# Patient Record
Sex: Female | Born: 1965 | Race: White | Hispanic: No | Marital: Married | Smoking: Former smoker
Health system: Southern US, Community
[De-identification: ages and names within clinical notes are randomized; demographics above are authoritative.]

## PROBLEM LIST (undated history)

## (undated) DIAGNOSIS — E785 Hyperlipidemia, unspecified: Secondary | ICD-10-CM

## (undated) DIAGNOSIS — Z973 Presence of spectacles and contact lenses: Secondary | ICD-10-CM

## (undated) DIAGNOSIS — R42 Dizziness and giddiness: Secondary | ICD-10-CM

## (undated) DIAGNOSIS — K5792 Diverticulitis of intestine, part unspecified, without perforation or abscess without bleeding: Secondary | ICD-10-CM

## (undated) DIAGNOSIS — K589 Irritable bowel syndrome without diarrhea: Secondary | ICD-10-CM

## (undated) HISTORY — DX: Hyperlipidemia, unspecified: E78.5

## (undated) HISTORY — DX: Diverticulitis of intestine, part unspecified, without perforation or abscess without bleeding: K57.92

## (undated) HISTORY — DX: Irritable bowel syndrome, unspecified: K58.9

---

## 2011-12-27 ENCOUNTER — Ambulatory Visit: Payer: Self-pay | Admitting: Family Medicine

## 2013-01-01 ENCOUNTER — Ambulatory Visit: Payer: Self-pay | Admitting: Family Medicine

## 2014-05-12 ENCOUNTER — Ambulatory Visit: Payer: Self-pay | Admitting: Family Medicine

## 2015-03-01 DIAGNOSIS — R519 Headache, unspecified: Secondary | ICD-10-CM | POA: Insufficient documentation

## 2015-03-01 DIAGNOSIS — Z9189 Other specified personal risk factors, not elsewhere classified: Secondary | ICD-10-CM | POA: Insufficient documentation

## 2015-03-01 DIAGNOSIS — K589 Irritable bowel syndrome without diarrhea: Secondary | ICD-10-CM | POA: Insufficient documentation

## 2015-03-01 DIAGNOSIS — Z0000 Encounter for general adult medical examination without abnormal findings: Secondary | ICD-10-CM | POA: Insufficient documentation

## 2015-03-01 DIAGNOSIS — R51 Headache: Secondary | ICD-10-CM

## 2015-03-02 ENCOUNTER — Encounter: Payer: Self-pay | Admitting: Family Medicine

## 2015-03-02 ENCOUNTER — Ambulatory Visit (INDEPENDENT_AMBULATORY_CARE_PROVIDER_SITE_OTHER): Payer: BLUE CROSS/BLUE SHIELD | Admitting: Family Medicine

## 2015-03-02 VITALS — BP 120/80 | HR 68 | Ht 62.0 in | Wt 137.0 lb

## 2015-03-02 DIAGNOSIS — K589 Irritable bowel syndrome without diarrhea: Secondary | ICD-10-CM | POA: Insufficient documentation

## 2015-03-02 DIAGNOSIS — G43809 Other migraine, not intractable, without status migrainosus: Secondary | ICD-10-CM

## 2015-03-02 DIAGNOSIS — Z87891 Personal history of nicotine dependence: Secondary | ICD-10-CM | POA: Insufficient documentation

## 2015-03-02 DIAGNOSIS — E782 Mixed hyperlipidemia: Secondary | ICD-10-CM | POA: Insufficient documentation

## 2015-03-02 DIAGNOSIS — R531 Weakness: Secondary | ICD-10-CM

## 2015-03-02 DIAGNOSIS — E785 Hyperlipidemia, unspecified: Secondary | ICD-10-CM | POA: Insufficient documentation

## 2015-03-02 LAB — GLUCOSE, POCT (MANUAL RESULT ENTRY): POC GLUCOSE: 102 mg/dL — AB (ref 70–99)

## 2015-03-02 NOTE — Progress Notes (Signed)
Name: Diane Marks   MRN: [id number]    DOB: 05/02/1966   Date:03/02/2015       Progress Note  Subjective  Chief Complaint  Chief Complaint  Patient presents with  . Eye Problem    sees "bright squiggly lines" at least once a day- had normal eye exam in April    Eye Problem  Both eyes are affected.This is a new problem. The current episode started 1 to 4 weeks ago. The problem occurs intermittently. The problem has been waxing and waning. There was no injury mechanism. The patient is experiencing no pain. There is no known exposure to pink eye. She wears contacts. Associated symptoms include nausea. Pertinent negatives include no blurred vision, double vision, fever, photophobia or tingling. Associated symptoms comments: "bright squigley line"/ generalized brightness/ afterwards generalized muscle wealness and overall fatigue.    No problem-specific assessment & plan notes found for this encounter.   Past Medical History  Diagnosis Date  . Irritable bowel syndrome   . Hyperlipidemia     Past Surgical History  Procedure Laterality Date  . Cesarean section      Family History  Problem Relation Age of Onset  . Hyperlipidemia Mother   . Diabetes Father   . Cancer Father   . Hyperlipidemia Father   . Hypertension Father   . Heart disease Maternal Grandmother   . Cancer Paternal Grandmother     History   Social History  . Marital Status: Married    Spouse Name: N/A  . Number of Children: N/A  . Years of Education: N/A   Occupational History  . Not on file.   Social History Main Topics  . Smoking status: Former Games developermoker  . Smokeless tobacco: Not on file  . Alcohol Use: 0.0 oz/week    0 Standard drinks or equivalent per week  . Drug Use: No  . Sexual Activity: Yes    Birth Control/ Protection: None   Other Topics Concern  . Not on file   Social History Narrative  . No narrative on file    No Known Allergies   Review of Systems  Constitutional: Negative  for fever, chills, weight loss and malaise/fatigue.  HENT: Negative for ear discharge, ear pain and sore throat.   Eyes: Negative for blurred vision, double vision and photophobia.  Respiratory: Negative for cough, sputum production, shortness of breath and wheezing.   Cardiovascular: Negative for chest pain, palpitations and leg swelling.  Gastrointestinal: Positive for nausea. Negative for heartburn, abdominal pain, diarrhea, constipation, blood in stool and melena.  Genitourinary: Negative for dysuria, urgency, frequency and hematuria.  Musculoskeletal: Negative for myalgias, back pain, joint pain and neck pain.  Skin: Negative for rash.  Neurological: Negative for dizziness, tingling, sensory change, focal weakness and headaches.  Endo/Heme/Allergies: Negative for environmental allergies and polydipsia. Does not bruise/bleed easily.  Psychiatric/Behavioral: Negative for depression and suicidal ideas. The patient is not nervous/anxious and does not have insomnia.   All other systems reviewed and are negative.    Objective  Filed Vitals:   03/02/15 0905  BP: 120/80  Pulse: 68  Height: 5\' 2"  (1.575 m)  Weight: 137 lb (62.143 kg)    Physical Exam  Constitutional: She is oriented to person, place, and time and well-developed, well-nourished, and in no distress.  HENT:  Head: Normocephalic.  Right Ear: External ear normal.  Left Ear: External ear normal.  Mouth/Throat: Oropharynx is clear and moist.  Eyes: Right eye visual fields normal and left eye  visual fields normal. Conjunctivae, EOM and lids are normal. Pupils are equal, round, and reactive to light. No scleral icterus.  Fundoscopic exam:      The right eye shows no AV nicking, no hemorrhage and no papilledema.       The left eye shows no AV nicking, no exudate, no hemorrhage and no papilledema.  Neck: Normal range of motion. Neck supple.  Cardiovascular: Normal rate, regular rhythm, normal heart sounds and intact distal  pulses.   No murmur heard. Pulmonary/Chest: Effort normal and breath sounds normal. She has no wheezes.  Abdominal: Soft. Bowel sounds are normal.  Neurological: She is alert and oriented to person, place, and time. She has normal sensation, normal strength, normal reflexes and intact cranial nerves. No cranial nerve deficit.  Psychiatric: Mood and affect normal.      Recent Results (from the past 2160 hour(s))  POCT Glucose (CBG)     Status: Abnormal   Collection Time: 03/02/15  9:29 AM  Result Value Ref Range   POC Glucose 102 (A) 70 - 99 mg/dl     Assessment & Plan  Problem List Items Addressed This Visit    None    Visit Diagnoses    Weakness    -  Primary    Relevant Orders    POCT Glucose (CBG) (Completed)    Ambulatory referral to Neurology    Migraine variant        Relevant Orders    Ambulatory referral to Neurology         Dr. Elizabeth Sauer [facility]  03/02/2015

## 2015-03-09 DIAGNOSIS — H539 Unspecified visual disturbance: Secondary | ICD-10-CM | POA: Insufficient documentation

## 2015-03-09 DIAGNOSIS — R5383 Other fatigue: Secondary | ICD-10-CM | POA: Insufficient documentation

## 2015-06-25 ENCOUNTER — Ambulatory Visit (INDEPENDENT_AMBULATORY_CARE_PROVIDER_SITE_OTHER): Payer: BLUE CROSS/BLUE SHIELD | Admitting: Family Medicine

## 2015-06-25 ENCOUNTER — Encounter: Payer: Self-pay | Admitting: Family Medicine

## 2015-06-25 VITALS — BP 120/80 | HR 72 | Ht 62.0 in | Wt 139.0 lb

## 2015-06-25 DIAGNOSIS — Z0000 Encounter for general adult medical examination without abnormal findings: Secondary | ICD-10-CM | POA: Diagnosis not present

## 2015-06-25 DIAGNOSIS — Z1211 Encounter for screening for malignant neoplasm of colon: Secondary | ICD-10-CM | POA: Diagnosis not present

## 2015-06-25 DIAGNOSIS — Z1239 Encounter for other screening for malignant neoplasm of breast: Secondary | ICD-10-CM

## 2015-06-25 LAB — POCT URINALYSIS DIPSTICK
BILIRUBIN UA: NEGATIVE
Blood, UA: NEGATIVE
GLUCOSE UA: NEGATIVE
Ketones, UA: NEGATIVE
LEUKOCYTES UA: NEGATIVE
Nitrite, UA: NEGATIVE
Protein, UA: NEGATIVE
Spec Grav, UA: <=1.005
UROBILINOGEN UA: 0.2
pH, UA: 5

## 2015-06-25 LAB — HEMOCCULT GUIAC POC 1CARD (OFFICE): FECAL OCCULT BLD: NEGATIVE

## 2015-06-25 NOTE — Patient Instructions (Signed)

## 2015-06-25 NOTE — Progress Notes (Signed)
Name: Diane Marks   MRN: [id number]    DOB: 1966-07-19   Date:06/25/2015       Progress Note  Subjective  Chief Complaint  Chief Complaint  Patient presents with  . Annual Exam    no pap- had in April but needs a mammo sched    HPI Comments: Breast screening with nosubjective/objective concerns   No problem-specific assessment & plan notes found for this encounter.   Past Medical History  Diagnosis Date  . Irritable bowel syndrome   . Hyperlipidemia     Past Surgical History  Procedure Laterality Date  . Cesarean section      Family History  Problem Relation Age of Onset  . Hyperlipidemia Mother   . Diabetes Father   . Cancer Father   . Hyperlipidemia Father   . Hypertension Father   . Heart disease Maternal Grandmother   . Cancer Paternal Grandmother     Social History   Social History  . Marital Status: Married    Spouse Name: N/A  . Number of Children: N/A  . Years of Education: N/A   Occupational History  . Not on file.   Social History Main Topics  . Smoking status: Former Games developer  . Smokeless tobacco: Not on file  . Alcohol Use: 0.0 oz/week    0 Standard drinks or equivalent per week  . Drug Use: No  . Sexual Activity: Yes    Birth Control/ Protection: None   Other Topics Concern  . Not on file   Social History Narrative    No Known Allergies   Review of Systems  Constitutional: Negative for fever, chills, weight loss and malaise/fatigue.  HENT: Negative for ear discharge, ear pain and sore throat.   Eyes: Negative for blurred vision.  Respiratory: Negative for cough, sputum production, shortness of breath and wheezing.   Cardiovascular: Negative for chest pain, palpitations and leg swelling.  Gastrointestinal: Negative for heartburn, nausea, abdominal pain, diarrhea, constipation, blood in stool and melena.  Genitourinary: Negative for dysuria, urgency, frequency and hematuria.  Musculoskeletal: Negative for myalgias, back pain,  joint pain and neck pain.  Skin: Negative for rash.  Neurological: Negative for dizziness, tingling, sensory change, focal weakness and headaches.  Endo/Heme/Allergies: Negative for environmental allergies and polydipsia. Does not bruise/bleed easily.  Psychiatric/Behavioral: Negative for depression and suicidal ideas. The patient is not nervous/anxious and does not have insomnia.      Objective  Filed Vitals:   06/25/15 0831  BP: 120/80  Pulse: 72  Height:  (1.575 m)  Weight: 139 lb (63.05 kg)    Physical Exam  Constitutional: She is well-developed, well-nourished, and in no distress. No distress.  HENT:  Head: Normocephalic and atraumatic.  Right Ear: External ear normal.  Left Ear: External ear normal.  Nose: Nose normal.  Mouth/Throat: Oropharynx is clear and moist.  Eyes: Conjunctivae and EOM are normal. Pupils are equal, round, and reactive to light. Right eye exhibits no discharge. Left eye exhibits no discharge.  Neck: Normal range of motion. Neck supple. No JVD present. No thyromegaly present.  Cardiovascular: Normal rate, regular rhythm, normal heart sounds and intact distal pulses.  Exam reveals no gallop and no friction rub.   No murmur heard. Pulmonary/Chest: Effort normal and breath sounds normal. Right breast exhibits no inverted nipple, no mass, no nipple discharge and no skin change. Left breast exhibits no inverted nipple, no mass, no nipple discharge and no skin change.  Abdominal: Soft. Normal appearance, normal aorta  and bowel sounds are normal. She exhibits no mass. There is no tenderness. There is no guarding.  Genitourinary: Rectum normal.  Musculoskeletal: Normal range of motion. She exhibits no edema.  Lymphadenopathy:    She has no cervical adenopathy.  Neurological: She is alert. She has normal sensation, normal strength, normal reflexes and intact cranial nerves.  Skin: Skin is warm and dry. She is not diaphoretic.  Psychiatric: Mood and affect  normal.  Nursing note and vitals reviewed.     Assessment & Plan  Problem List Items Addressed This Visit      Other   Routine general medical examination at a health care facility - Primary   Relevant Orders   Lipid Profile   Renal Function Panel   POCT Urinalysis Dipstick    Other Visit Diagnoses    Colon cancer screening        Relevant Orders    POCT Occult Blood Stool    Ambulatory referral to Gastroenterology    Breast cancer screening        Relevant Orders    MM Digital Screening         Dr. Elizabeth Sauer [facility]  06/25/2015

## 2015-06-26 LAB — RENAL FUNCTION PANEL
Albumin: 4.3 g/dL (ref 3.5–5.5)
BUN/Creatinine Ratio: 14 (ref 9–23)
BUN: 11 mg/dL (ref 6–24)
CO2: 21 mmol/L (ref 18–29)
Calcium: 8.6 mg/dL — ABNORMAL LOW (ref 8.7–10.2)
Chloride: 104 mmol/L (ref 97–108)
Creatinine, Ser: 0.81 mg/dL (ref 0.57–1.00)
GFR calc non Af Amer: 86 mL/min/{1.73_m2} (ref >59)
GFR, EST AFRICAN AMERICAN: 99 mL/min/{1.73_m2} (ref >59)
GLUCOSE: 90 mg/dL (ref 65–99)
POTASSIUM: 4.2 mmol/L (ref 3.5–5.2)
Phosphorus: 2.8 mg/dL (ref 2.5–4.5)
Sodium: 140 mmol/L (ref 134–144)

## 2015-06-26 LAB — LIPID PANEL
Chol/HDL Ratio: 4 ratio units (ref 0.0–4.4)
Cholesterol, Total: 178 mg/dL (ref 100–199)
HDL: 45 mg/dL (ref >39)
LDL Calculated: 120 mg/dL — ABNORMAL HIGH (ref 0–99)
TRIGLYCERIDES: 64 mg/dL (ref 0–149)
VLDL CHOLESTEROL CAL: 13 mg/dL (ref 5–40)

## 2015-06-30 ENCOUNTER — Ambulatory Visit
Admission: RE | Admit: 2015-06-30 | Discharge: 2015-06-30 | Disposition: A | Discharge status: Home / Self Care | Payer: BLUE CROSS/BLUE SHIELD | Source: Ambulatory Visit | Attending: Family Medicine | Admitting: Family Medicine

## 2015-06-30 DIAGNOSIS — Z1239 Encounter for other screening for malignant neoplasm of breast: Secondary | ICD-10-CM

## 2015-06-30 DIAGNOSIS — Z1231 Encounter for screening mammogram for malignant neoplasm of breast: Secondary | ICD-10-CM | POA: Diagnosis present

## 2015-07-01 ENCOUNTER — Other Ambulatory Visit: Payer: Self-pay

## 2015-07-01 ENCOUNTER — Telehealth: Payer: Self-pay

## 2015-07-01 NOTE — Telephone Encounter (Signed)
Gastroenterology Pre-Procedure Review  Request Date: 07/30/15 Requesting Physician: Dr. Yetta Barre  PATIENT REVIEW QUESTIONS: The patient responded to the following health history questions as indicated:    1. Are you having any GI issues? no 2. Do you have a personal history of Polyps? no 3. Do you have a family history of Colon Cancer or Polyps? no 4. Diabetes Mellitus? no 5. Joint replacements in the past 12 months?no 6. Major health problems in the past 3 months?no 7. Any artificial heart valves, MVP, or defibrillator?no    MEDICATIONS & ALLERGIES:    Patient reports the following regarding taking any anticoagulation/antiplatelet therapy:   Plavix, Coumadin, Eliquis, Xarelto, Lovenox, Pradaxa, Brilinta, or Effient? no Aspirin? no  Patient confirms/reports the following medications:  No current outpatient prescriptions on file.   No current facility-administered medications for this visit.    Patient confirms/reports the following allergies:  No Known Allergies  No orders of the defined types were placed in this encounter.    AUTHORIZATION INFORMATION Primary Insurance: 1D#: Group #:  Secondary Insurance: 1D#: Group #:  SCHEDULE INFORMATION: Date: 07/30/15 Time: Location: [facility]

## 2015-07-29 ENCOUNTER — Encounter: Payer: Self-pay | Admitting: *Deleted

## 2015-08-04 NOTE — Discharge Instructions (Signed)

## 2015-08-05 ENCOUNTER — Ambulatory Visit: Payer: BLUE CROSS/BLUE SHIELD | Admitting: Anesthesiology

## 2015-08-05 ENCOUNTER — Ambulatory Visit
Admission: RE | Admit: 2015-08-05 | Discharge: 2015-08-05 | Disposition: A | Discharge status: Home / Self Care | Payer: BLUE CROSS/BLUE SHIELD | Source: Ambulatory Visit | Attending: Gastroenterology | Admitting: Gastroenterology

## 2015-08-05 ENCOUNTER — Encounter
Admission: RE | Disposition: A | Discharge status: Home / Self Care | Payer: Self-pay | Source: Ambulatory Visit | Attending: Gastroenterology

## 2015-08-05 DIAGNOSIS — Z79899 Other long term (current) drug therapy: Secondary | ICD-10-CM | POA: Diagnosis not present

## 2015-08-05 DIAGNOSIS — Z1211 Encounter for screening for malignant neoplasm of colon: Secondary | ICD-10-CM | POA: Diagnosis present

## 2015-08-05 DIAGNOSIS — Z87891 Personal history of nicotine dependence: Secondary | ICD-10-CM | POA: Diagnosis not present

## 2015-08-05 DIAGNOSIS — E785 Hyperlipidemia, unspecified: Secondary | ICD-10-CM | POA: Insufficient documentation

## 2015-08-05 DIAGNOSIS — K589 Irritable bowel syndrome without diarrhea: Secondary | ICD-10-CM | POA: Diagnosis not present

## 2015-08-05 HISTORY — DX: Presence of spectacles and contact lenses: Z97.3

## 2015-08-05 HISTORY — DX: Dizziness and giddiness: R42

## 2015-08-05 HISTORY — PX: COLONOSCOPY WITH PROPOFOL: SHX5780

## 2015-08-05 SURGERY — COLONOSCOPY WITH PROPOFOL
Anesthesia: General

## 2015-08-05 MED ORDER — LIDOCAINE HCL (CARDIAC) 20 MG/ML IV SOLN
INTRAVENOUS | Status: DC | PRN
  Administered 2015-08-05: 30 mg via INTRAVENOUS

## 2015-08-05 MED ORDER — SIMETHICONE 40 MG/0.6ML PO SUSP
ORAL | Status: DC | PRN
  Administered 2015-08-05: 09:00:00

## 2015-08-05 MED ORDER — LACTATED RINGERS IV SOLN: 500.0000 mL | INTRAVENOUS | Status: DC

## 2015-08-05 MED ORDER — LACTATED RINGERS IV SOLN
INTRAVENOUS | Status: DC
  Administered 2015-08-05 (×2): via INTRAVENOUS

## 2015-08-05 MED ORDER — PROPOFOL 10 MG/ML IV BOLUS
INTRAVENOUS | Status: DC | PRN
  Administered 2015-08-05: 40 mg via INTRAVENOUS
  Administered 2015-08-05: 100 mg via INTRAVENOUS
  Administered 2015-08-05 (×2): 40 mg via INTRAVENOUS

## 2015-08-05 SURGICAL SUPPLY — 29 items
CANISTER SUCT 1200ML W/VALVE (MISCELLANEOUS) ×2 IMPLANT
FCP ESCP3.2XJMB 240X2.8X (MISCELLANEOUS)
FORCEPS BIOP RAD 4 LRG CAP 4 (CUTTING FORCEPS) IMPLANT
FORCEPS BIOP RJ4 240 W/NDL (MISCELLANEOUS)
FORCEPS ESCP3.2XJMB 240X2.8X (MISCELLANEOUS) IMPLANT
GOWN CVR UNV OPN BCK APRN NK (MISCELLANEOUS) ×2 IMPLANT
GOWN ISOL THUMB LOOP REG UNIV (MISCELLANEOUS) ×2
HEMOCLIP INSTINCT (CLIP) IMPLANT
INJECTOR VARIJECT VIN23 (MISCELLANEOUS) IMPLANT
KIT CO2 TUBING (TUBING) IMPLANT
KIT DEFENDO VALVE AND CONN (KITS) IMPLANT
KIT ENDO PROCEDURE OLY (KITS) ×2 IMPLANT
KIT ROOM TURNOVER OR (KITS) ×2 IMPLANT
LIGATOR MULTIBAND 6SHOOTER MBL (MISCELLANEOUS) IMPLANT
MARKER SPOT ENDO TATTOO 5ML (MISCELLANEOUS) IMPLANT
PAD GROUND ADULT SPLIT (MISCELLANEOUS) IMPLANT
SNARE SHORT THROW 13M SML OVAL (MISCELLANEOUS) IMPLANT
SNARE SHORT THROW 30M LRG OVAL (MISCELLANEOUS) IMPLANT
SPOT EX ENDOSCOPIC TATTOO (MISCELLANEOUS)
SUCTION POLY TRAP 4CHAMBER (MISCELLANEOUS) IMPLANT
TRAP SUCTION POLY (MISCELLANEOUS) IMPLANT
TUBING CONN 6MMX3.1M (TUBING)
TUBING SUCTION CONN 0.25 STRL (TUBING) IMPLANT
UNDERPAD 30X60 958B10 (PK) (MISCELLANEOUS) IMPLANT
VALVE BIOPSY ENDO (VALVE) IMPLANT
VARIJECT INJECTOR VIN23 (MISCELLANEOUS)
WATER AUXILLARY (MISCELLANEOUS) IMPLANT
WATER STERILE IRR 250ML POUR (IV SOLUTION) ×2 IMPLANT
WATER STERILE IRR 500ML POUR (IV SOLUTION) IMPLANT

## 2015-08-05 NOTE — H&P (Signed)
  [facility]  [address] [place], [place] [address] Phone: [phone] Fax : [phone]  Primary Care Physician:  Elizabeth Sauereanna Jones, MD Primary Gastroenterologist:  Dr. Servando SnareWohl  Pre-Procedure History & Physical: HPI:  Diane Marks is a 49 y.o. female is here for a screening colonoscopy.   Past Medical History  Diagnosis Date  . Irritable bowel syndrome   . Hyperlipidemia   . Wears contact lenses   . Vertigo     no episodes 12-18 months    Past Surgical History  Procedure Laterality Date  . Cesarean section      Prior to Admission medications   Medication Sig Start Date End Date Taking? Authorizing Provider  Cholecalciferol (VITAMIN D-3 PO) Take by mouth.   Yes Historical Provider, MD  Multiple Vitamin (MULTIVITAMIN) tablet Take 1 tablet by mouth daily.   Yes Historical Provider, MD    Allergies as of 07/01/2015  . (No Known Allergies)    Family History  Problem Relation Age of Onset  . Hyperlipidemia Mother   . Diabetes Father   . Cancer Father   . Hyperlipidemia Father   . Hypertension Father   . Heart disease Maternal Grandmother   . Cancer Paternal Grandmother     Social History   Social History  . Marital Status: Married    Spouse Name: N/A  . Number of Children: N/A  . Years of Education: N/A   Occupational History  . Not on file.   Social History Main Topics  . Smoking status: Former Games developermoker  . Smokeless tobacco: Not on file     Comment: quit 20 yrs ago  . Alcohol Use: 0.0 oz/week    0 Standard drinks or equivalent per week     Comment: rare  . Drug Use: No  . Sexual Activity: Yes    Birth Control/ Protection: None   Other Topics Concern  . Not on file   Social History Narrative    Review of Systems: See HPI, otherwise negative ROS  Physical Exam: BP 122/85 mmHg  Pulse 92  Temp(Src) 97.5 F (36.4 C) (Temporal)  Resp 16  Ht 5\' 2"  (1.575 m)  Wt 136 lb (61.689 kg)  BMI 24.87 kg/m2  SpO2 98%  LMP  07/27/2015 (Exact Date) General:   Alert,  pleasant and cooperative in NAD Head:  Normocephalic and atraumatic. Neck:  Supple; no masses or thyromegaly. Lungs:  Clear throughout to auscultation.    Heart:  Regular rate and rhythm. Abdomen:  Soft, nontender and nondistended. Normal bowel sounds, without guarding, and without rebound.   Neurologic:  Alert and  oriented x4;  grossly normal neurologically.  Impression/Plan: Diane Marks is now here to undergo a screening colonoscopy.  Risks, benefits, and alternatives regarding colonoscopy have been reviewed with the patient.  Questions have been answered.  All parties agreeable.

## 2015-08-05 NOTE — Anesthesia Preprocedure Evaluation (Signed)
Anesthesia Evaluation  Patient identified by MRN, date of birth, ID band Patient awake    Reviewed: Allergy & Precautions, H&P , NPO status , Patient's Chart, lab work & pertinent test results, reviewed documented beta blocker date and time   Airway Mallampati: II  TM Distance: >3 FB Neck ROM: full    Dental no notable dental hx.    Pulmonary neg pulmonary ROS, former smoker,    Pulmonary exam normal breath sounds clear to auscultation       Cardiovascular Exercise Tolerance: Good negative cardio ROS   Rhythm:regular Rate:Normal     Neuro/Psych negative neurological ROS  negative psych ROS   GI/Hepatic negative GI ROS, Neg liver ROS,   Endo/Other  negative endocrine ROS  Renal/GU negative Renal ROS  negative genitourinary   Musculoskeletal   Abdominal   Peds  Hematology negative hematology ROS (+)   Anesthesia Other Findings   Reproductive/Obstetrics negative OB ROS                             Anesthesia Physical Anesthesia Plan  ASA: II  Anesthesia Plan: General   Post-op Pain Management:    Induction:   Airway Management Planned:   Additional Equipment:   Intra-op Plan:   Post-operative Plan:   Informed Consent: I have reviewed the patients History and Physical, chart, labs and discussed the procedure including the risks, benefits and alternatives for the proposed anesthesia with the patient or authorized representative who has indicated his/her understanding and acceptance.     Plan Discussed with: CRNA  Anesthesia Plan Comments:         Anesthesia Quick Evaluation

## 2015-08-05 NOTE — Anesthesia Postprocedure Evaluation (Signed)
  Anesthesia Post-op Note  Patient: Diane Marks  Procedure(s) Performed: Procedure(s): COLONOSCOPY WITH PROPOFOL (N/A)  Anesthesia type:General  Patient location: PACU  Post pain: Pain level controlled  Post assessment: Post-op Vital signs reviewed, Patient's Cardiovascular Status Stable, Respiratory Function Stable, Patent Airway and No signs of Nausea or vomiting  Post vital signs: Reviewed and stable  Last Vitals:  Filed Vitals:   08/05/15 0915  BP:   Pulse:   Temp:   Resp: 11    Level of consciousness: awake, alert  and patient cooperative  Complications: No apparent anesthesia complications

## 2015-08-05 NOTE — Op Note (Signed)
[facility]astroenterology Patient Name: Diane ElliotDeborah Marks Procedure Date: 08/05/2015 8:29 AM MRN: [id number] Account #: [id number] Date of Birth: 05/20/1966 Admit Type: Outpatient Age: 49 [facility] Gender: Female Note Status: Finalized Procedure:         Colonoscopy Indications:       Screening for colorectal malignant neoplasm Providers:         Midge Miniumarren Jeryl Wilbourn, MD Referring MD:      Duanne Limerickeanna C. Jones, MD (Referring MD) Medicines:         Propofol per Anesthesia Complications:     No immediate complications. Procedure:         Pre-Anesthesia Assessment:                    - Prior to the procedure, a History and Physical was                     performed, and patient medications and allergies were                     reviewed. The patient's tolerance of previous anesthesia                     was also reviewed. The risks and benefits of the procedure                     and the sedation options and risks were discussed with the                     patient. All questions were answered, and informed consent                     was obtained. Prior Anticoagulants: The patient has taken                     no previous anticoagulant or antiplatelet agents. ASA                     Grade Assessment: II - A patient with mild systemic                     disease. After reviewing the risks and benefits, the                     patient was deemed in satisfactory condition to undergo                     the procedure.                    After obtaining informed consent, the colonoscope was                     passed under direct vision. Throughout the procedure, the                     patient's blood pressure, pulse, and oxygen saturations                     were monitored continuously. The Olympus CF-HQ190L                     Colonoscope (S#. [id number]) was introduced through the anus  and advanced to the the cecum, identified by appendiceal                 orifice and ileocecal valve. The colonoscopy was performed                     without difficulty. The patient tolerated the procedure                     well. The quality of the bowel preparation was excellent. Findings:      The perianal and digital rectal examinations were normal.      The colon (entire examined portion) appeared normal. Impression:        - The entire examined colon is normal.                    - No specimens collected. Recommendation:    - Repeat colonoscopy in 10 years for screening unless any                     change in family history or lower GI problems. Procedure Code(s): --- Professional ---                    [phone], Colonoscopy, flexible; diagnostic, including                     collection of specimen(s) by brushing or washing, when                     performed (separate procedure) Diagnosis Code(s): --- Professional ---                    Z12.11, Encounter for screening for malignant neoplasm of                     colon CPT copyright 2014 American Medical Association. All rights reserved. The codes documented in this report are preliminary and upon coder review may  be revised to meet current compliance requirements. Midge Minium, MD 08/05/2015 8:53:32 AM This report has been signed electronically. Number of Addenda: 0 Note Initiated On: 08/05/2015 8:29 AM Scope Withdrawal Time: 0 hours 6 minutes 22 seconds  Total Procedure Duration: 0 hours 10 minutes 10 seconds       [facility]

## 2015-08-05 NOTE — Anesthesia Procedure Notes (Signed)
Procedure Name: MAC Performed by: Breezy Hertenstein Pre-anesthesia Checklist: Patient identified, Emergency Drugs available, Suction available, Patient being monitored and Timeout performed Patient Re-evaluated:Patient Re-evaluated prior to inductionOxygen Delivery Method: Nasal cannula       

## 2015-08-05 NOTE — Transfer of Care (Signed)
Immediate Anesthesia Transfer of Care Note  Patient: Diane Marks  Procedure(s) Performed: Procedure(s): COLONOSCOPY WITH PROPOFOL (N/A)  Patient Location: PACU  Anesthesia Type: General  Level of Consciousness: awake, alert  and patient cooperative  Airway and Oxygen Therapy: Patient Spontanous Breathing and Patient connected to supplemental oxygen  Post-op Assessment: Post-op Vital signs reviewed, Patient's Cardiovascular Status Stable, Respiratory Function Stable, Patent Airway and No signs of Nausea or vomiting  Post-op Vital Signs: Reviewed and stable  Complications: No apparent anesthesia complications

## 2015-08-06 ENCOUNTER — Encounter: Payer: Self-pay | Admitting: Gastroenterology

## 2016-03-20 ENCOUNTER — Ambulatory Visit (INDEPENDENT_AMBULATORY_CARE_PROVIDER_SITE_OTHER): Payer: BLUE CROSS/BLUE SHIELD | Admitting: Family Medicine

## 2016-03-20 ENCOUNTER — Encounter: Payer: Self-pay | Admitting: Family Medicine

## 2016-03-20 VITALS — BP 110/80 | HR 68 | Ht 62.0 in | Wt 139.0 lb

## 2016-03-20 DIAGNOSIS — L259 Unspecified contact dermatitis, unspecified cause: Secondary | ICD-10-CM

## 2016-03-20 DIAGNOSIS — L0100 Impetigo, unspecified: Secondary | ICD-10-CM

## 2016-03-20 MED ORDER — MUPIROCIN 2 % EX OINT: 1.0000 "application " | TOPICAL_OINTMENT | Freq: Two times a day (BID) | CUTANEOUS | Status: DC

## 2016-03-20 MED ORDER — PREDNISONE 10 MG PO TABS: ORAL_TABLET | ORAL | Status: DC

## 2016-03-20 MED ORDER — SULFAMETHOXAZOLE-TRIMETHOPRIM 800-160 MG PO TABS: 1.0000 | ORAL_TABLET | Freq: Two times a day (BID) | ORAL | Status: DC

## 2016-03-20 NOTE — Progress Notes (Signed)
Name: Loel RoDeborah J Arriola   MRN: [id number]    DOB: 01/03/1966   Date:03/20/2016       Progress Note  Subjective  Chief Complaint  Chief Complaint  Patient presents with  . Rash    poison oak on arms, face and possibly in R) eye    Rash This is a new problem. The current episode started in the past 7 days. The problem has been gradually worsening since onset. The affected locations include the face, left arm, right arm, right ankle and left ankle. The rash is characterized by blistering, itchiness and redness. She was exposed to plant contact. Pertinent negatives include no cough, diarrhea, fever, joint pain, shortness of breath or sore throat.    No problem-specific assessment & plan notes found for this encounter.   Past Medical History  Diagnosis Date  . Irritable bowel syndrome   . Hyperlipidemia   . Wears contact lenses   . Vertigo     no episodes 12-18 months    Past Surgical History  Procedure Laterality Date  . Cesarean section    . Colonoscopy with propofol N/A 08/05/2015    Procedure: COLONOSCOPY WITH PROPOFOL;  Surgeon: Midge Miniumarren Wohl, MD;  [facility];  Service: Endoscopy;  Laterality: N/A;    Family History  Problem Relation Age of Onset  . Hyperlipidemia Mother   . Diabetes Father   . Cancer Father   . Hyperlipidemia Father   . Hypertension Father   . Heart disease Maternal Grandmother   . Cancer Paternal Grandmother     Social History   Social History  . Marital Status: Married    Spouse Name: N/A  . Number of Children: N/A  . Years of Education: N/A   Occupational History  . Not on file.   Social History Main Topics  . Smoking status: Former Games developermoker  . Smokeless tobacco: Not on file     Comment: quit 20 yrs ago  . Alcohol Use: 0.0 oz/week    0 Standard drinks or equivalent per week     Comment: rare  . Drug Use: No  . Sexual Activity: Yes    Birth Control/ Protection: None   Other Topics Concern  . Not on file   Social  History Narrative    No Known Allergies   Review of Systems  Constitutional: Negative for fever, chills, weight loss and malaise/fatigue.  HENT: Negative for ear discharge, ear pain and sore throat.   Eyes: Negative for blurred vision.  Respiratory: Negative for cough, sputum production, shortness of breath and wheezing.   Cardiovascular: Negative for chest pain, palpitations and leg swelling.  Gastrointestinal: Negative for heartburn, nausea, abdominal pain, diarrhea, constipation, blood in stool and melena.  Genitourinary: Negative for dysuria, urgency, frequency and hematuria.  Musculoskeletal: Negative for myalgias, back pain, joint pain and neck pain.  Skin: Positive for rash.  Neurological: Negative for dizziness, tingling, sensory change, focal weakness and headaches.  Endo/Heme/Allergies: Negative for environmental allergies and polydipsia. Does not bruise/bleed easily.  Psychiatric/Behavioral: Negative for depression and suicidal ideas. The patient is not nervous/anxious and does not have insomnia.      Objective  Filed Vitals:   03/20/16 1633  BP: 110/80  Pulse: 68  Height: 5\' 2"  (1.575 m)  Weight: 139 lb (63.05 kg)    Physical Exam  Constitutional: She is well-developed, well-nourished, and in no distress. No distress.  HENT:  Head: Normocephalic and atraumatic.  Right Ear: External ear normal.  Left Ear: External  ear normal.  Nose: Nose normal.  Mouth/Throat: Oropharynx is clear and moist.  Eyes: Conjunctivae and EOM are normal. Pupils are equal, round, and reactive to light. Right eye exhibits no discharge. Left eye exhibits no discharge.  Neck: Normal range of motion. Neck supple. No JVD present. No thyromegaly present.  Cardiovascular: Normal rate, regular rhythm, normal heart sounds and intact distal pulses.  Exam reveals no gallop and no friction rub.   No murmur heard. Pulmonary/Chest: Effort normal and breath sounds normal.  Abdominal: Soft. Bowel  sounds are normal. She exhibits no mass. There is no tenderness. There is no guarding.  Musculoskeletal: Normal range of motion. She exhibits no edema.  Lymphadenopathy:    She has no cervical adenopathy.  Neurological: She is alert.  Skin: Skin is warm and dry. She is not diaphoretic.  Psychiatric: Mood and affect normal.  Nursing note and vitals reviewed.     Assessment & Plan  Problem List Items Addressed This Visit    None    Visit Diagnoses    Contact dermatitis    -  Primary    Impetigo        Relevant Medications    sulfamethoxazole-trimethoprim (BACTRIM DS,SEPTRA DS) 800-160 MG tablet    mupirocin ointment (BACTROBAN) 2 %         Dr. Hayden Rasmusseneanna Micah Barnier [facility]  03/20/2016

## 2016-08-30 ENCOUNTER — Other Ambulatory Visit: Payer: Self-pay | Admitting: Family Medicine

## 2016-09-13 ENCOUNTER — Other Ambulatory Visit: Payer: Self-pay | Admitting: Family Medicine

## 2016-09-13 ENCOUNTER — Other Ambulatory Visit: Payer: Self-pay | Admitting: Obstetrics and Gynecology

## 2016-09-13 DIAGNOSIS — Z1231 Encounter for screening mammogram for malignant neoplasm of breast: Secondary | ICD-10-CM

## 2016-10-02 ENCOUNTER — Ambulatory Visit
Admission: RE | Admit: 2016-10-02 | Discharge: 2016-10-02 | Disposition: A | Discharge status: Home / Self Care | Payer: BLUE CROSS/BLUE SHIELD | Source: Ambulatory Visit | Attending: Obstetrics and Gynecology | Admitting: Obstetrics and Gynecology

## 2016-10-02 DIAGNOSIS — Z1231 Encounter for screening mammogram for malignant neoplasm of breast: Secondary | ICD-10-CM | POA: Diagnosis present

## 2016-10-05 ENCOUNTER — Other Ambulatory Visit: Payer: Self-pay | Admitting: Obstetrics and Gynecology

## 2016-10-05 DIAGNOSIS — R928 Other abnormal and inconclusive findings on diagnostic imaging of breast: Secondary | ICD-10-CM

## 2016-10-05 DIAGNOSIS — N6310 Unspecified lump in the right breast, unspecified quadrant: Secondary | ICD-10-CM

## 2016-10-06 ENCOUNTER — Ambulatory Visit
Admission: RE | Admit: 2016-10-06 | Discharge: 2016-10-06 | Disposition: A | Discharge status: Home / Self Care | Payer: BLUE CROSS/BLUE SHIELD | Source: Ambulatory Visit | Attending: Obstetrics and Gynecology | Admitting: Obstetrics and Gynecology

## 2016-10-06 DIAGNOSIS — N6310 Unspecified lump in the right breast, unspecified quadrant: Secondary | ICD-10-CM | POA: Diagnosis present

## 2016-10-06 DIAGNOSIS — R928 Other abnormal and inconclusive findings on diagnostic imaging of breast: Secondary | ICD-10-CM

## 2016-10-18 ENCOUNTER — Ambulatory Visit (INDEPENDENT_AMBULATORY_CARE_PROVIDER_SITE_OTHER): Payer: BLUE CROSS/BLUE SHIELD | Admitting: Family Medicine

## 2016-10-18 VITALS — BP 110/78 | HR 100 | Temp 99.4°F | Ht 62.0 in | Wt 137.0 lb

## 2016-10-18 DIAGNOSIS — R509 Fever, unspecified: Secondary | ICD-10-CM

## 2016-10-18 DIAGNOSIS — J219 Acute bronchiolitis, unspecified: Secondary | ICD-10-CM | POA: Diagnosis not present

## 2016-10-18 LAB — POCT INFLUENZA A/B
Influenza A, POC: NEGATIVE
Influenza B, POC: NEGATIVE

## 2016-10-18 MED ORDER — GUAIFENESIN-CODEINE 100-10 MG/5ML PO SYRP: 5.0000 mL | ORAL_SOLUTION | Freq: Three times a day (TID) | ORAL | 0 refills | Status: DC | PRN

## 2016-10-18 MED ORDER — AZITHROMYCIN 250 MG PO TABS: ORAL_TABLET | ORAL | 0 refills | Status: DC

## 2016-10-18 NOTE — Progress Notes (Signed)
Name: Diane Marks   MRN: [id number]    DOB: Dec 03, 1965   Date:10/18/2016       Progress Note  Subjective  Chief Complaint  Chief Complaint  Patient presents with  . Cough    with fever, no production, headache x 1 day- came on all of a sudden yesterday with a fever of 101.9    Cough  This is a new problem. The current episode started yesterday. The problem has been waxing and waning. The cough is non-productive. Associated symptoms include chills, ear pain, a fever and headaches. Pertinent negatives include no chest pain, ear congestion, heartburn, hemoptysis, myalgias, nasal congestion, postnasal drip, rash, rhinorrhea, sore throat, shortness of breath, sweats, weight loss or wheezing. Associated symptoms comments: Bitemporal and frontal headache. Nothing aggravates the symptoms. She has tried OTC cough suppressant for the symptoms. The treatment provided moderate relief. Her past medical history is significant for pneumonia. There is no history of asthma, bronchiectasis, bronchitis, COPD, emphysema or environmental allergies.  Fever   This is a new problem. The current episode started yesterday. The problem has been waxing and waning. The maximum temperature noted was 100 to 100.9 F. Associated symptoms include congestion, coughing, ear pain and headaches. Pertinent negatives include no abdominal pain, chest pain, diarrhea, muscle aches, nausea, rash, sore throat, urinary pain, vomiting or wheezing. Treatments tried: Nyquil. The treatment provided mild relief.    No problem-specific Assessment & Plan notes found for this encounter.   Past Medical History:  Diagnosis Date  . Hyperlipidemia   . Irritable bowel syndrome   . Vertigo    no episodes 12-18 months  . Wears contact lenses     Past Surgical History:  Procedure Laterality Date  . CESAREAN SECTION    . COLONOSCOPY WITH PROPOFOL N/A 08/05/2015   Procedure: COLONOSCOPY WITH PROPOFOL;  Surgeon: Midge Minium, MD;  Location:  [facility];  Service: Endoscopy;  Laterality: N/A;    Family History  Problem Relation Age of Onset  . Hyperlipidemia Mother   . Diabetes Father   . Cancer Father   . Hyperlipidemia Father   . Hypertension Father   . Heart disease Maternal Grandmother   . Cancer Paternal Grandmother   . Breast cancer Neg Hx     Social History   Social History  . Marital status: Married    Spouse name: N/A  . Number of children: N/A  . Years of education: N/A   Occupational History  . Not on file.   Social History Main Topics  . Smoking status: Former Games developer  . Smokeless tobacco: Not on file     Comment: quit 20 yrs ago  . Alcohol use 0.0 oz/week     Comment: rare  . Drug use: No  . Sexual activity: Yes    Birth control/ protection: None   Other Topics Concern  . Not on file   Social History Narrative  . No narrative on file    No Known Allergies   Review of Systems  Constitutional: Positive for chills and fever. Negative for malaise/fatigue and weight loss.  HENT: Positive for congestion, ear pain and sinus pain. Negative for ear discharge, nosebleeds, postnasal drip, rhinorrhea and sore throat.   Eyes: Negative for blurred vision.  Respiratory: Positive for cough. Negative for hemoptysis, sputum production, shortness of breath, wheezing and stridor.   Cardiovascular: Negative for chest pain, palpitations, leg swelling and PND.  Gastrointestinal: Negative for abdominal pain, blood in stool, constipation, diarrhea, heartburn, melena, nausea  and vomiting.  Genitourinary: Negative for dysuria, frequency, hematuria and urgency.  Musculoskeletal: Negative for back pain, joint pain, myalgias and neck pain.  Skin: Negative for rash.  Neurological: Positive for headaches. Negative for dizziness, tingling, sensory change and focal weakness.  Endo/Heme/Allergies: Negative for environmental allergies and polydipsia. Does not bruise/bleed easily.  Psychiatric/Behavioral:  Negative for depression and suicidal ideas. The patient is not nervous/anxious and does not have insomnia.      Objective  Vitals:   10/18/16 1001  BP: 110/78  Pulse: 100  Temp: 99.4 F (37.4 C)  TempSrc: Oral  Weight: 137 lb (62.1 kg)  Height: 5\' 2"  (1.575 m)    Physical Exam  Constitutional: She is well-developed, well-nourished, and in no distress. No distress.  HENT:  Head: Normocephalic and atraumatic.  Right Ear: External ear normal.  Left Ear: External ear normal.  Nose: Nose normal.  Mouth/Throat: Oropharynx is clear and moist.  Eyes: Conjunctivae and EOM are normal. Pupils are equal, round, and reactive to light. Right eye exhibits no discharge. Left eye exhibits no discharge.  Neck: Normal range of motion. Neck supple. No JVD present. No thyromegaly present.  Cardiovascular: Normal rate, regular rhythm, normal heart sounds and intact distal pulses.  Exam reveals no gallop and no friction rub.   No murmur heard. Pulmonary/Chest: Effort normal and breath sounds normal. She has no wheezes. She has no rales.  Abdominal: Soft. Bowel sounds are normal. She exhibits no mass. There is no tenderness. There is no guarding.  Musculoskeletal: Normal range of motion. She exhibits no edema.  Lymphadenopathy:    She has no cervical adenopathy.  Neurological: She is alert.  Skin: Skin is warm and dry. She is not diaphoretic.  Psychiatric: Mood and affect normal.      Assessment & Plan  Problem List Items Addressed This Visit    None    Visit Diagnoses    Fever and chills    -  Primary   Relevant Orders   POCT Influenza A/B (Completed)   Bronchiolitis       Relevant Medications   azithromycin (ZITHROMAX) 250 MG tablet   guaiFENesin-codeine (ROBITUSSIN AC) 100-10 MG/5ML syrup        Dr. Hayden Rasmusseneanna Nayzeth Altman [facility]  10/18/16

## 2016-10-19 ENCOUNTER — Emergency Department
Admission: EM | Admit: 2016-10-19 | Discharge: 2016-10-19 | Disposition: A | Discharge status: Home / Self Care | Payer: BLUE CROSS/BLUE SHIELD | Attending: Emergency Medicine | Admitting: Emergency Medicine

## 2016-10-19 ENCOUNTER — Encounter: Payer: Self-pay | Admitting: Emergency Medicine

## 2016-10-19 ENCOUNTER — Ambulatory Visit: Payer: BLUE CROSS/BLUE SHIELD | Admitting: Family Medicine

## 2016-10-19 DIAGNOSIS — Z79899 Other long term (current) drug therapy: Secondary | ICD-10-CM | POA: Diagnosis not present

## 2016-10-19 DIAGNOSIS — J111 Influenza due to unidentified influenza virus with other respiratory manifestations: Secondary | ICD-10-CM

## 2016-10-19 DIAGNOSIS — R509 Fever, unspecified: Secondary | ICD-10-CM | POA: Diagnosis present

## 2016-10-19 DIAGNOSIS — H9203 Otalgia, bilateral: Secondary | ICD-10-CM | POA: Insufficient documentation

## 2016-10-19 DIAGNOSIS — R69 Illness, unspecified: Secondary | ICD-10-CM

## 2016-10-19 DIAGNOSIS — Z87891 Personal history of nicotine dependence: Secondary | ICD-10-CM | POA: Insufficient documentation

## 2016-10-19 LAB — BASIC METABOLIC PANEL
Anion gap: 6 (ref 5–15)
BUN: 7 mg/dL (ref 6–20)
CO2: 25 mmol/L (ref 22–32)
CREATININE: 0.91 mg/dL (ref 0.44–1.00)
Calcium: 8.4 mg/dL — ABNORMAL LOW (ref 8.9–10.3)
Chloride: 105 mmol/L (ref 101–111)
GFR calc Af Amer: >60 mL/min (ref >60)
GLUCOSE: 106 mg/dL — AB (ref 65–99)
POTASSIUM: 4 mmol/L (ref 3.5–5.1)
Sodium: 136 mmol/L (ref 135–145)

## 2016-10-19 LAB — CBC WITH DIFFERENTIAL/PLATELET
Basophils Absolute: 0 10*3/uL (ref 0–0.1)
Basophils Relative: 1 %
Eosinophils Absolute: 0 10*3/uL (ref 0–0.7)
Eosinophils Relative: 0 %
HCT: 43.6 % (ref 35.0–47.0)
Hemoglobin: 15 g/dL (ref 12.0–16.0)
LYMPHS ABS: 0.8 10*3/uL — AB (ref 1.0–3.6)
LYMPHS PCT: 19 %
MCH: 30.8 pg (ref 26.0–34.0)
MCHC: 34.3 g/dL (ref 32.0–36.0)
MCV: 89.7 fL (ref 80.0–100.0)
MONO ABS: 0.4 10*3/uL (ref 0.2–0.9)
MONOS PCT: 10 %
Neutro Abs: 3 10*3/uL (ref 1.4–6.5)
Neutrophils Relative %: 70 %
Platelets: 134 10*3/uL — ABNORMAL LOW (ref 150–440)
RBC: 4.86 MIL/uL (ref 3.80–5.20)
RDW: 13.1 % (ref 11.5–14.5)
WBC: 4.2 10*3/uL (ref 3.6–11.0)

## 2016-10-19 MED ORDER — ACETAMINOPHEN 325 MG PO TABS
ORAL_TABLET | ORAL | Status: AC
  Filled 2016-10-19: qty 2

## 2016-10-19 MED ORDER — ACETAMINOPHEN 325 MG PO TABS
650.0000 mg | ORAL_TABLET | Freq: Once | ORAL | Status: AC
  Administered 2016-10-19: 650 mg via ORAL

## 2016-10-19 NOTE — ED Triage Notes (Signed)
Patient presents to ED via POV from home with c/o flu like symptoms since Tuesday. Patient c/o bilateral ear pain, HA, fever and chills. Patient was seen at her PCP yesterday, tested negative for the flu. Was given a z pack and pain medications and sent home. Patient here because she is not feeling any better. Patient A&O x4.

## 2016-10-19 NOTE — ED Notes (Addendum)
See triage note.  Had negative flu test and placed on z-pak  States just does not feel any better  Low grade fever on arrival  But states she was concerned b/c her fever went to 97 and then back up again

## 2016-10-19 NOTE — Discharge Instructions (Signed)
Your exam and labs were normal today. You likely have a viral bronchitis and may have influenza, despite a negative rapid test. Continue to dose the prescription meds as provided. Consider starting OTC allergy medicine, nasal sprays, or room humidifier for symptom relief. Follow-up with your provider for continued symptoms. Rest and hydrate.

## 2016-10-19 NOTE — ED Provider Notes (Signed)
[facility] Emergency Department Provider Note ____________________________________________  Time seen: 1113  I have reviewed the triage vital signs and the nursing notes.  HISTORY  Chief Complaint  Fever  HPI Diane Marks is a 51 y.o. female presents to the ED for evaluation after being seen by her PCP yesterday for flu-like symptoms. She describes onset of symptoms, 2 days prior. She reports a Tmax of 102 F, and has been dosing IBU for fever control. She reports a negative rapid flu test, but was discharged with a diagnosis of bronchitis, and prescribed azithromycin and Robitussin AC. She presents today, because she was concerned after awakening with shaking chills. She reports an oral temperature of 97.69F prior, and then a temp of 100.67F. She was worried about the fluctuations. She complains otherwise, of only chest discomfort from coughing. She denies any nausea, vomiting or diarrhea.    Past Medical History:  Diagnosis Date  . Hyperlipidemia   . Irritable bowel syndrome   . Vertigo    no episodes 12-18 months  . Wears contact lenses     Patient Active Problem List   Diagnosis Date Noted  . Special screening for malignant neoplasms, colon   . History of tobacco use 03/02/2015  . HLD (hyperlipidemia) 03/02/2015  . 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 Routine general medical examination at a health care facility 03/01/2015  . At risk of disease 03/01/2015  . Cephalalgia 03/01/2015  . Irritable bowel syndrome 03/01/2015    Past Surgical History:  Procedure Laterality Date  . CESAREAN SECTION    . COLONOSCOPY WITH PROPOFOL N/A 08/05/2015   Procedure: COLONOSCOPY WITH PROPOFOL;  Surgeon: Midge Minium, MD;  Location: [facility];  Service: Endoscopy;  Laterality: N/A;    Prior to Admission medications   Medication Sig Start Date End Date Taking? Authorizing Provider  azithromycin (ZITHROMAX) 250 MG tablet 2 today then 1 a day for 4 days  10/18/16   Duanne Limerick, MD  Cholecalciferol (VITAMIN D-3 PO) Take by mouth.    Historical Provider, MD  guaiFENesin-codeine (ROBITUSSIN AC) 100-10 MG/5ML syrup Take 5 mLs by mouth 3 (three) times daily as needed for cough. 10/18/16   Duanne Limerick, MD  Multiple Vitamin (MULTIVITAMIN) tablet Take 1 tablet by mouth daily.    Historical Provider, MD    Allergies Patient has no known allergies.  Family History  Problem Relation Age of Onset  . Hyperlipidemia Mother   . Diabetes Father   . Cancer Father   . Hyperlipidemia Father   . Hypertension Father   . Heart disease Maternal Grandmother   . Cancer Paternal Grandmother   . Breast cancer Neg Hx     Social History Social History  Substance Use Topics  . Smoking status: Former Games developer  . Smokeless tobacco: Not on file     Comment: quit 20 yrs ago  . Alcohol use 0.0 oz/week     Comment: rare    Review of Systems  Constitutional:Is ar fever. Eyes: Negative for visual changes. ENT: Negative for sore throat. Cardiovascular: Negative for chest pain. Respiratory: Negative for shortness of breath. Porch nonoperative cough as above.  Gastrointestinal: Negative for abdominal pain, vomiting and diarrhea. Genitourinary: Negative for dysuria. Neurological: Negative for headaches, focal weakness or numbness. ____________________________________________  PHYSICAL EXAM:  VITAL SIGNS: ED Triage Vitals  Enc Vitals Group     BP 10/19/16 0931 133/78     Pulse Rate 10/19/16 0931 (!) 118     Resp 10/19/16 0931  16     Temp 10/19/16 0931 (!) 100.9 F (38.3 C)     Temp Source 10/19/16 0931 Oral     SpO2 10/19/16 0931 99 %     Weight --      Height --      Head Circumference --      Peak Flow --      Pain Score 10/19/16 1004 4     Pain Loc --      Pain Edu? --      Excl. in GC? --     Constitutional: Alert and oriented. Well appearing and in no distress. Head: Normocephalic and atraumatic. Eyes: Conjunctivae are normal. PERRL.  Normal extraocular movements Ears: Canals clear. TMs intact bilaterally. Nose: No congestion/rhinorrhea/epistaxis. Mouth/Throat: Mucous membranes are moist. Neck: Supple. No thyromegaly. Hematological/Lymphatic/Immunological: No cervical lymphadenopathy. Cardiovascular: Normal rate, regular rhythm. Normal distal pulses. Respiratory: Normal respiratory effort. No wheezes/rales/rhonchi. Gastrointestinal: Soft and nontender. No distention. Musculoskeletal: Nontender with normal range of motion in all extremities.  Neurologic:  Normal gait without ataxia. Normal speech and language. No gross focal neurologic deficits are appreciated. Skin:  Skin is warm, dry and intact. No rash noted. ____________________________________________   LABS (pertinent positives/negatives)  Labs Reviewed  CBC WITH DIFFERENTIAL/PLATELET - Abnormal; Notable for the following:       Result Value   Platelets 134 (*)    Lymphs Abs 0.8 (*)    All other components within normal limits  BASIC METABOLIC PANEL - Abnormal; Notable for the following:    Glucose, Bld 106 (*)    Calcium 8.4 (*)    All other components within normal limits  ____________________________________________  PROCEDURES  Tylenol 650 mg PO ____________________________________________  INITIAL IMPRESSION / ASSESSMENT AND PLAN / ED COURSE  Patient with presentation of influenze-like illness and viral bronchitis. She is responding to antipyretics and tolerating PO fluids. She is encouraged to continue with her previously prescribed meds and consider OTC allergy medicines. Follow-up with the PCP as needed. Patient is reassured by her normal exam and labs.  ____________________________________________  FINAL CLINICAL IMPRESSION(S) / ED DIAGNOSES  Final diagnoses:  Influenza-like illness      Lissa HoardJenise V Bacon Jaterrius Ricketson, PA-C 10/19/16 1244    Nita Sicklearolina Veronese, MD 10/20/16 1942

## 2016-10-24 ENCOUNTER — Other Ambulatory Visit: Payer: Self-pay

## 2017-08-13 ENCOUNTER — Other Ambulatory Visit (INDEPENDENT_AMBULATORY_CARE_PROVIDER_SITE_OTHER): Payer: BLUE CROSS/BLUE SHIELD

## 2017-08-13 ENCOUNTER — Encounter: Payer: Self-pay | Admitting: Obstetrics and Gynecology

## 2017-08-13 ENCOUNTER — Ambulatory Visit (INDEPENDENT_AMBULATORY_CARE_PROVIDER_SITE_OTHER): Payer: BLUE CROSS/BLUE SHIELD | Admitting: Obstetrics and Gynecology

## 2017-08-13 VITALS — BP 134/82 | HR 80 | Ht 61.5 in | Wt 145.0 lb

## 2017-08-13 DIAGNOSIS — N938 Other specified abnormal uterine and vaginal bleeding: Secondary | ICD-10-CM

## 2017-08-13 NOTE — Patient Instructions (Signed)
I value your feedback and entrusting us with your care. If you get a [facility] patient survey, I would appreciate you taking the time to let us know about your experience today. Thank you! 

## 2017-08-13 NOTE — Progress Notes (Signed)
Chief Complaint  Patient presents with  . Gynecologic Exam    Abnormal Bleeding x 1 mo    HPI:      [name] is a 51 y.o. No obstetric history on file. who LMP was Patient's last menstrual period was 08/09/2017., presents today for DUB sx. At her 12/17 annual, her Menses were Q3-6 wks, lasting 3-4 days, 1 episode bleeding Q2 wks. She has conitnued to follow that pattern since last yr until recently, although missed a period altogether this summer. She had a period 07/16/17, then again 07/26/17 and 08/09/17. Bleeding is lasting 5 days, med flow, with dime sized clots, mild dysmen.  She is sex active, no postcoital bleeding/pain.    Past Medical History:  Diagnosis Date  . Hyperlipidemia   . Irritable bowel syndrome   . Vertigo    no episodes 12-18 months  . Wears contact lenses     Past Surgical History:  Procedure Laterality Date  . CESAREAN SECTION    . COLONOSCOPY WITH PROPOFOL N/A 08/05/2015   Performed by Midge MiniumWohl, Darren[facility]    Family History  Problem Relation Age of Onset  . Hyperlipidemia Mother   . Diabetes Father   . Cancer Father   . Hyperlipidemia Father   . Hypertension Father   . Heart disease Maternal Grandmother   . Cancer Paternal Grandmother   . Breast cancer Neg Hx     Social History   Socioeconomic History  . Marital status: Married    Spouse name: Not on file  . Number of children: Not on file  . Years of education: Not on file  . Highest education level: Not on file  Social Needs  . Financial resource strain: Not on file  . Food insecurity - worry: Not on file  . Food insecurity - inability: Not on file  . Transportation needs - medical: Not on file  . Transportation needs - non-medical: Not on file  Occupational History  . Not on file  Tobacco Use  . Smoking status: Former Games developermoker  . Smokeless tobacco: Never Used  . Tobacco comment: quit 20 yrs ago  Substance and Sexual Activity  . Alcohol use: Yes   Alcohol/week: 0.0 oz    Comment: rare  . Drug use: No  . Sexual activity: Yes    Birth control/protection: None  Other Topics Concern  . Not on file  Social History Narrative  . Not on file     Current Outpatient Medications:  .  azithromycin (ZITHROMAX) 250 MG tablet, 2 today then 1 a day for 4 days (Patient not taking: Reported on 08/13/2017), Disp: 6 tablet, Rfl: 0 .  Cholecalciferol (VITAMIN D-3 PO), Take by mouth., Disp: , Rfl:  .  guaiFENesin-codeine (ROBITUSSIN AC) 100-10 MG/5ML syrup, Take 5 mLs by mouth 3 (three) times daily as needed for cough. (Patient not taking: Reported on 08/13/2017), Disp: 150 mL, Rfl: 0 .  Multiple Vitamin (MULTIVITAMIN) tablet, Take 1 tablet by mouth daily., Disp: , Rfl:    ROS:  Review of Systems  Constitutional: Negative for fever.  Gastrointestinal: Negative for blood in stool, constipation, diarrhea, nausea and vomiting.  Genitourinary: Positive for menstrual problem. Negative for dyspareunia, dysuria, flank pain, frequency, hematuria, urgency, vaginal bleeding, vaginal discharge and vaginal pain.  Musculoskeletal: Negative for back pain.  Skin: Negative for rash.     OBJECTIVE:   Vitals:  BP 134/82 (BP Location: Left Arm, Patient Position: Sitting, Cuff Size: Normal)  Pulse 80   Ht 5' 1.5" (1.562 m)   Wt 145 lb (65.8 kg)   LMP 08/09/2017   BMI 26.95 kg/m   Physical Exam  Constitutional: She is oriented to person, place, and time and well-developed, well-nourished, and in no distress. Vital signs are normal.  Abdominal: Soft. Normal appearance. There is no tenderness.  Genitourinary: Vagina normal, uterus normal, cervix normal, right adnexa normal, left adnexa normal and vulva normal. Uterus is not enlarged. Cervix exhibits no motion tenderness and no tenderness. Right adnexum displays no mass and no tenderness. Left adnexum displays no mass and no tenderness. Vulva exhibits no erythema, no exudate, no lesion, no rash and no  tenderness. Vagina exhibits no lesion.  Neurological: She is oriented to person, place, and time.  Vitals reviewed.   Results: GYN U/S-->   Assessment/Plan: DUB (dysfunctional uterine bleeding) - Neg GYN u/s. Check labs. Will f/u with results. If neg, discussed watch and wait vs IUD/BC/ablation. Pt has annual 12/18 and can f/u with cycles then, too. - Plan: TSH, Prolactin, US PELVIS TRANSVANGINAL NON-OB (TV ONLY)    Return if symptoms worsen or fail to improve.  Alicia B. Copland, PA-C 08/13/2017 10:58 AM

## 2017-08-14 LAB — PROLACTIN: PROLACTIN: 5.5 ng/mL (ref 4.8–23.3)

## 2017-08-14 LAB — TSH: TSH: 3.08 u[IU]/mL (ref 0.450–4.500)

## 2017-09-19 ENCOUNTER — Ambulatory Visit: Payer: Self-pay | Admitting: Obstetrics and Gynecology

## 2017-10-09 ENCOUNTER — Ambulatory Visit (INDEPENDENT_AMBULATORY_CARE_PROVIDER_SITE_OTHER): Payer: BLUE CROSS/BLUE SHIELD | Admitting: Obstetrics and Gynecology

## 2017-10-09 ENCOUNTER — Encounter: Payer: Self-pay | Admitting: Obstetrics and Gynecology

## 2017-10-09 VITALS — BP 110/80 | HR 86 | Ht 62.0 in | Wt 144.0 lb

## 2017-10-09 DIAGNOSIS — Z01419 Encounter for gynecological examination (general) (routine) without abnormal findings: Secondary | ICD-10-CM | POA: Diagnosis not present

## 2017-10-09 DIAGNOSIS — N951 Menopausal and female climacteric states: Secondary | ICD-10-CM | POA: Insufficient documentation

## 2017-10-09 DIAGNOSIS — Z1239 Encounter for other screening for malignant neoplasm of breast: Secondary | ICD-10-CM

## 2017-10-09 DIAGNOSIS — Z1231 Encounter for screening mammogram for malignant neoplasm of breast: Secondary | ICD-10-CM

## 2017-10-09 NOTE — Patient Instructions (Addendum)
I value your feedback and entrusting us with your care. If you get a [facility] patient survey, I would appreciate you taking the time to let us know about your experience today. Thank you!  [facility]: [phone]    

## 2017-10-09 NOTE — Progress Notes (Signed)
PCP: Duanne Limerick, MD   Chief Complaint  Patient presents with  . Gynecologic Exam    HPI:      [name] is a 52 y.o. [id number] who LMP was Patient's last menstrual period was 09/30/2017., presents today for her annual examination.  Her menses are Q3-6 wks, lasting 4-5 days. She had 1 episode DUB 11/18 and had a neg u/s/eval. Sx resolved since then. No 12/18 menses. Dysmenorrhea none.   She does not have vasomotor sx.  Sex activity: single partner, contraception - none. She does not have vaginal dryness.  Last Pap: September 13, 2016  Results were: no abnormalities /neg HPV DNA.  Hx of STDs: none  Last mammogram: October 06, 2016  Results were: normal--routine follow-up in 12 months There is no FH of breast cancer. There is no FH of ovarian cancer. The patient does not do self-breast exams.  Colonoscopy: colonoscopy 3 years ago without abnormalities. . Repeat due after 10 years.   Tobacco use: The patient denies current or previous tobacco use. Alcohol use: none Exercise: not active  She does get adequate calcium and Vitamin D in her diet.  Labs with PCP  Past Medical History:  Diagnosis Date  . Hyperlipidemia   . Irritable bowel syndrome   . Vertigo    no episodes 12-18 months  . Wears contact lenses     Past Surgical History:  Procedure Laterality Date  . CESAREAN SECTION    . COLONOSCOPY WITH PROPOFOL N/A 08/05/2015   Procedure: COLONOSCOPY WITH PROPOFOL;  Surgeon: Midge Minium, MD;  Location[facility];  Service: Endoscopy;  Laterality: N/A;    Family History  Problem Relation Age of Onset  . Hyperlipidemia Mother   . Diabetes Father   . Hyperlipidemia Father   . Hypertension Father   . Pancreatic cancer Father 31  . Heart disease Maternal Grandmother   . Cancer Paternal Grandmother 65       mouth  . Breast cancer Neg Hx     Social History   Socioeconomic History  . Marital status: Married    Spouse name: Not on file  .  Number of children: Not on file  . Years of education: Not on file  . Highest education level: Not on file  Social Needs  . Financial resource strain: Not on file  . Food insecurity - worry: Not on file  . Food insecurity - inability: Not on file  . Transportation needs - medical: Not on file  . Transportation needs - non-medical: Not on file  Occupational History  . Not on file  Tobacco Use  . Smoking status: Former Games developer  . Smokeless tobacco: Never Used  . Tobacco comment: quit 20 yrs ago  Substance and Sexual Activity  . Alcohol use: Yes    Alcohol/week: 0.0 oz    Comment: rare  . Drug use: No  . Sexual activity: Yes    Birth control/protection: None  Other Topics Concern  . Not on file  Social History Narrative  . Not on file    No outpatient medications have been marked as taking for the 10/09/17 encounter (Office Visit) with Travez Stancil, Ilona Sorrel, PA-C.      ROS:  Review of Systems  Constitutional: Negative for fatigue, fever and unexpected weight change.  Respiratory: Negative for cough, shortness of breath and wheezing.   Cardiovascular: Negative for chest pain, palpitations and leg swelling.  Gastrointestinal: Negative for blood in stool, constipation, diarrhea, nausea and  vomiting.  Endocrine: Negative for cold intolerance, heat intolerance and polyuria.  Genitourinary: Negative for dyspareunia, dysuria, flank pain, frequency, genital sores, hematuria, menstrual problem, pelvic pain, urgency, vaginal bleeding, vaginal discharge and vaginal pain.  Musculoskeletal: Negative for back pain, joint swelling and myalgias.  Skin: Negative for rash.  Neurological: Negative for dizziness, syncope, light-headedness, numbness and headaches.  Hematological: Negative for adenopathy.  Psychiatric/Behavioral: Negative for agitation, confusion, sleep disturbance and suicidal ideas. The patient is not nervous/anxious.      Objective: BP 110/80   Pulse 86   Ht 5\' 2"  (1.575 m)    Wt 144 lb (65.3 kg)   LMP 09/30/2017   BMI 26.34 kg/m    Physical Exam  Constitutional: She is oriented to person, place, and time. She appears well-developed and well-nourished.  Genitourinary: Vagina normal and uterus normal. There is no rash or tenderness on the right labia. There is no rash or tenderness on the left labia. No erythema or tenderness in the vagina. No vaginal discharge found. Right adnexum does not display mass and does not display tenderness. Left adnexum does not display mass and does not display tenderness. Cervix does not exhibit motion tenderness or polyp. Uterus is not enlarged or tender.  Neck: Normal range of motion. No thyromegaly present.  Cardiovascular: Normal rate, regular rhythm and normal heart sounds.  No murmur heard. Pulmonary/Chest: Effort normal and breath sounds normal. Right breast exhibits no mass, no nipple discharge, no skin change and no tenderness. Left breast exhibits no mass, no nipple discharge, no skin change and no tenderness.  Abdominal: Soft. There is no tenderness. There is no guarding.  Musculoskeletal: Normal range of motion.  Neurological: She is alert and oriented to person, place, and time. No cranial nerve deficit.  Psychiatric: She has a normal mood and affect. Her behavior is normal.  Vitals reviewed.    Assessment/Plan:  Encounter for annual routine gynecological examination  Screening for breast cancer - Pt to sched mammo. - Plan: MM SCREENING BREAST TOMO BILATERAL  Perimenopause - Pt to f/u prn DUB.         GYN counsel mammography screening, menopause, adequate intake of calcium and vitamin D, diet and exercise    F/U  Return in about 1 year (around 10/09/2018).  Jlon Betker B. Kelle Ruppert, PA-C 10/09/2017 2:47 PM

## 2017-11-19 ENCOUNTER — Ambulatory Visit
Admission: RE | Admit: 2017-11-19 | Discharge: 2017-11-19 | Disposition: A | Discharge status: Home / Self Care | Payer: BLUE CROSS/BLUE SHIELD | Source: Ambulatory Visit | Attending: Obstetrics and Gynecology | Admitting: Obstetrics and Gynecology

## 2017-11-19 DIAGNOSIS — Z1231 Encounter for screening mammogram for malignant neoplasm of breast: Secondary | ICD-10-CM | POA: Diagnosis not present

## 2017-11-19 DIAGNOSIS — Z1239 Encounter for other screening for malignant neoplasm of breast: Secondary | ICD-10-CM

## 2017-11-20 ENCOUNTER — Encounter: Payer: Self-pay | Admitting: Obstetrics and Gynecology

## 2018-01-22 ENCOUNTER — Encounter: Payer: Self-pay | Admitting: Family Medicine

## 2018-01-22 ENCOUNTER — Ambulatory Visit (INDEPENDENT_AMBULATORY_CARE_PROVIDER_SITE_OTHER): Payer: BLUE CROSS/BLUE SHIELD | Admitting: Family Medicine

## 2018-01-22 VITALS — BP 120/80 | HR 80 | Ht 62.0 in | Wt 144.0 lb

## 2018-01-22 DIAGNOSIS — L247 Irritant contact dermatitis due to plants, except food: Secondary | ICD-10-CM

## 2018-01-22 MED ORDER — PREDNISONE 10 MG PO TABS: ORAL_TABLET | ORAL | 1 refills | Status: DC

## 2018-01-22 NOTE — Progress Notes (Signed)
Name: Diane Marks   MRN: [id number]    DOB: 1965-11-17   Date:01/22/2018       Progress Note  Subjective  Chief Complaint  Chief Complaint  Patient presents with  . Rash    poison oak- face and hands from weeding    Rash  This is a new problem. The current episode started in the past 7 days. The problem is unchanged. The affected locations include the face, right arm and right hand. The rash is characterized by redness and itchiness. Associated symptoms include facial edema. Pertinent negatives include no anorexia, congestion, cough, diarrhea, eye pain, fatigue, fever, joint pain, nail changes, rhinorrhea, shortness of breath, sore throat or vomiting. Past treatments include anti-itch cream. The treatment provided no relief.    No problem-specific Assessment & Plan notes found for this encounter.   Past Medical History:  Diagnosis Date  . Hyperlipidemia   . Irritable bowel syndrome   . Vertigo    no episodes 12-18 months  . Wears contact lenses     Past Surgical History:  Procedure Laterality Date  . CESAREAN SECTION    . COLONOSCOPY WITH PROPOFOL N/A 08/05/2015   Procedure: COLONOSCOPY WITH PROPOFOL;  Surgeon: Midge Minium, MD;  Location: [facility];  Service: Endoscopy;  Laterality: N/A;    Family History  Problem Relation Age of Onset  . Hyperlipidemia Mother   . Diabetes Father   . Hyperlipidemia Father   . Hypertension Father   . Pancreatic cancer Father 55  . Heart disease Maternal Grandmother   . Cancer Paternal Grandmother 65       mouth  . Breast cancer Neg Hx     Social History   Socioeconomic History  . Marital status: Married    Spouse name: Not on file  . Number of children: Not on file  . Years of education: Not on file  . Highest education level: Not on file  Occupational History  . Not on file  Social Needs  . Financial resource strain: Not on file  . Food insecurity:    Worry: Not on file    Inability: Not on file  .  Transportation needs:    Medical: Not on file    Non-medical: Not on file  Tobacco Use  . Smoking status: Former Games developer  . Smokeless tobacco: Never Used  . Tobacco comment: quit 20 yrs ago  Substance and Sexual Activity  . Alcohol use: Yes    Alcohol/week: 0.0 oz    Comment: rare  . Drug use: No  . Sexual activity: Yes    Birth control/protection: None  Lifestyle  . Physical activity:    Days per week: Not on file    Minutes per session: Not on file  . Stress: Not on file  Relationships  . Social connections:    Talks on phone: Not on file    Gets together: Not on file    Attends religious service: Not on file    Active member of club or organization: Not on file    Attends meetings of clubs or organizations: Not on file    Relationship status: Not on file  . Intimate partner violence:    Fear of current or ex partner: Not on file    Emotionally abused: Not on file    Physically abused: Not on file    Forced sexual activity: Not on file  Other Topics Concern  . Not on file  Social History Narrative  . Not  on file    No Known Allergies  Outpatient Medications Prior to Visit  Medication Sig Dispense Refill  . Cholecalciferol (VITAMIN D-3 PO) Take by mouth.    . Multiple Vitamin (MULTIVITAMIN) tablet Take 1 tablet by mouth daily.    Marland Kitchen azithromycin (ZITHROMAX) 250 MG tablet 2 today then 1 a day for 4 days 6 tablet 0  . guaiFENesin-codeine (ROBITUSSIN AC) 100-10 MG/5ML syrup Take 5 mLs by mouth 3 (three) times daily as needed for cough. (Patient not taking: Reported on 08/13/2017) 150 mL 0   No facility-administered medications prior to visit.     Review of Systems  Constitutional: Negative for chills, fatigue, fever, malaise/fatigue and weight loss.  HENT: Negative for congestion, ear discharge, ear pain, rhinorrhea and sore throat.   Eyes: Negative for blurred vision and pain.  Respiratory: Negative for cough, sputum production, shortness of breath and wheezing.    Cardiovascular: Negative for chest pain, palpitations and leg swelling.  Gastrointestinal: Negative for abdominal pain, anorexia, blood in stool, constipation, diarrhea, heartburn, melena, nausea and vomiting.  Genitourinary: Negative for dysuria, frequency, hematuria and urgency.  Musculoskeletal: Negative for back pain, joint pain, myalgias and neck pain.  Skin: Positive for rash. Negative for nail changes.  Neurological: Negative for dizziness, tingling, sensory change, focal weakness and headaches.  Endo/Heme/Allergies: Negative for environmental allergies and polydipsia. Does not bruise/bleed easily.  Psychiatric/Behavioral: Negative for depression and suicidal ideas. The patient is not nervous/anxious and does not have insomnia.      Objective  Vitals:   01/22/18 1452  BP: 120/80  Pulse: 80  Weight: 144 lb (65.3 kg)  Height:  (1.575 m)    Physical Exam  Constitutional: She is oriented to person, place, and time. She appears well-developed and well-nourished.  HENT:  Head: Normocephalic.  Right Ear: External ear normal.  Left Ear: External ear normal.  Mouth/Throat: Oropharynx is clear and moist.  Eyes: Pupils are equal, round, and reactive to light. Conjunctivae and EOM are normal. Lids are everted and swept, no foreign bodies found. Left eye exhibits no hordeolum. No foreign body present in the left eye. Right conjunctiva is not injected. Left conjunctiva is not injected. No scleral icterus.  Neck: Normal range of motion. Neck supple. No JVD present. No tracheal deviation present. No thyromegaly present.  Cardiovascular: Normal rate, regular rhythm, normal heart sounds and intact distal pulses. Exam reveals no gallop and no friction rub.  No murmur heard. Pulmonary/Chest: Effort normal and breath sounds normal. No respiratory distress. She has no wheezes. She has no rales.  Abdominal: Soft. Bowel sounds are normal. She exhibits no mass. There is no hepatosplenomegaly.  There is no tenderness. There is no rebound and no guarding.  Musculoskeletal: Normal range of motion. She exhibits no edema or tenderness.  Lymphadenopathy:    She has no cervical adenopathy.  Neurological: She is alert and oriented to person, place, and time. She has normal strength. She displays normal reflexes. No cranial nerve deficit.  Skin: Skin is warm. No rash noted.  Psychiatric: She has a normal mood and affect. Her mood appears not anxious. She does not exhibit a depressed mood.  Nursing note and vitals reviewed.     Assessment & Plan  Problem List Items Addressed This Visit    None    Visit Diagnoses    Contact dermatitis and eczema due to plant    -  Primary   Relevant Medications   predniSONE (DELTASONE) 10 MG tablet  Meds ordered this encounter  Medications  . predniSONE (DELTASONE) 10 MG tablet    Sig: Taper 6,6,6,5,5,5,4,4,3,3,2,2,1,1    Dispense:  53 tablet    Refill:  1      Dr. Elizabeth Sauer [facility]  01/22/18

## 2018-06-10 ENCOUNTER — Encounter: Payer: Self-pay | Admitting: Family Medicine

## 2018-06-10 ENCOUNTER — Ambulatory Visit (INDEPENDENT_AMBULATORY_CARE_PROVIDER_SITE_OTHER): Payer: BLUE CROSS/BLUE SHIELD | Admitting: Family Medicine

## 2018-06-10 VITALS — BP 120/80 | HR 68 | Ht 62.0 in | Wt 138.0 lb

## 2018-06-10 DIAGNOSIS — Z23 Encounter for immunization: Secondary | ICD-10-CM

## 2018-06-10 DIAGNOSIS — Z0000 Encounter for general adult medical examination without abnormal findings: Secondary | ICD-10-CM

## 2018-06-10 NOTE — Progress Notes (Signed)
Name: Diane Marks   MRN: [id number]    DOB: 07-Jan-1966   Date:06/10/2018       Progress Note  Subjective  Chief Complaint  Chief Complaint  Patient presents with  . Annual Exam    no pap    Patient is a 52 year old female who presents for a comprehensive physical exam. The patient reports the following problems: none. Health maintenance has been reviewed tdap and influenza.   No problem-specific Assessment & Plan notes found for this encounter.   Past Medical History:  Diagnosis Date  . Hyperlipidemia   . Irritable bowel syndrome   . Vertigo    no episodes 12-18 months  . Wears contact lenses     Past Surgical History:  Procedure Laterality Date  . CESAREAN SECTION    . COLONOSCOPY WITH PROPOFOL N/A 08/05/2015   Procedure: COLONOSCOPY WITH PROPOFOL;  Surgeon: Midge Minium, MD;  Location: [facility];  Service: Endoscopy;  Laterality: N/A;    Family History  Problem Relation Age of Onset  . Hyperlipidemia Mother   . Diabetes Father   . Hyperlipidemia Father   . Hypertension Father   . Pancreatic cancer Father 75  . Heart disease Maternal Grandmother   . Cancer Paternal Grandmother 65       mouth  . Breast cancer Neg Hx     Social History   Socioeconomic History  . Marital status: Married    Spouse name: Not on file  . Number of children: Not on file  . Years of education: Not on file  . Highest education level: Not on file  Occupational History  . Not on file  Social Needs  . Financial resource strain: Not on file  . Food insecurity:    Worry: Not on file    Inability: Not on file  . Transportation needs:    Medical: Not on file    Non-medical: Not on file  Tobacco Use  . Smoking status: Former Games developer  . Smokeless tobacco: Never Used  . Tobacco comment: quit 20 yrs ago  Substance and Sexual Activity  . Alcohol use: Yes    Alcohol/week: 0.0 standard drinks    Comment: rare  . Drug use: No  . Sexual activity: Yes    Birth  control/protection: None  Lifestyle  . Physical activity:    Days per week: Not on file    Minutes per session: Not on file  . Stress: Not on file  Relationships  . Social connections:    Talks on phone: Not on file    Gets together: Not on file    Attends religious service: Not on file    Active member of club or organization: Not on file    Attends meetings of clubs or organizations: Not on file    Relationship status: Not on file  . Intimate partner violence:    Fear of current or ex partner: Not on file    Emotionally abused: Not on file    Physically abused: Not on file    Forced sexual activity: Not on file  Other Topics Concern  . Not on file  Social History Narrative  . Not on file    No Known Allergies  Outpatient Medications Prior to Visit  Medication Sig Dispense Refill  . Black Cohosh 40 MG CAPS Take 2 capsules by mouth 2 (two) times daily.    . Cholecalciferol (VITAMIN D-3 PO) Take by mouth.    . Multiple Vitamin (MULTIVITAMIN)  tablet Take 1 tablet by mouth daily.    . predniSONE (DELTASONE) 10 MG tablet Taper 6,6,6,5,5,5,4,4,3,3,2,2,1,1 53 tablet 1   No facility-administered medications prior to visit.     Review of Systems  Constitutional: Negative for chills, fever, malaise/fatigue and weight loss.  HENT: Negative for ear discharge, ear pain and sore throat.   Eyes: Negative for blurred vision.  Respiratory: Negative for cough, sputum production, shortness of breath and wheezing.   Cardiovascular: Negative for chest pain, palpitations and leg swelling.  Gastrointestinal: Negative for abdominal pain, blood in stool, constipation, diarrhea, heartburn, melena and nausea.  Genitourinary: Negative for dysuria, frequency, hematuria and urgency.  Musculoskeletal: Negative for back pain, joint pain, myalgias and neck pain.  Skin: Negative for rash.  Neurological: Negative for dizziness, tingling, sensory change, focal weakness and headaches.   Endo/Heme/Allergies: Negative for environmental allergies and polydipsia. Does not bruise/bleed easily.  Psychiatric/Behavioral: Negative for depression and suicidal ideas. The patient is not nervous/anxious and does not have insomnia.      Objective  Vitals:   06/10/18 0948  BP: 120/80  Pulse: 68  Weight: 138 lb (62.6 kg)  Height: 5\' 2"  (1.575 m)    Physical Exam  Constitutional: She is oriented to person, place, and time. She appears well-developed and well-nourished.  HENT:  Head: Normocephalic.  Right Ear: External ear normal.  Left Ear: External ear normal.  Mouth/Throat: Oropharynx is clear and moist.  Eyes: Pupils are equal, round, and reactive to light. Conjunctivae and EOM are normal. Lids are everted and swept, no foreign bodies found. Left eye exhibits no hordeolum. No foreign body present in the left eye. Right conjunctiva is not injected. Left conjunctiva is not injected. No scleral icterus.  Neck: Normal range of motion. Neck supple. No JVD present. No tracheal deviation present. No thyromegaly present.  Cardiovascular: Normal rate, regular rhythm, normal heart sounds and intact distal pulses. Exam reveals no gallop and no friction rub.  No murmur heard. Pulmonary/Chest: Effort normal and breath sounds normal. No respiratory distress. She has no wheezes. She has no rales.  Abdominal: Soft. Bowel sounds are normal. She exhibits no mass. There is no hepatosplenomegaly. There is no tenderness. There is no rebound and no guarding.  Musculoskeletal: Normal range of motion. She exhibits no edema or tenderness.  Lymphadenopathy:    She has no cervical adenopathy.  Neurological: She is alert and oriented to person, place, and time. She has normal strength. She displays normal reflexes. No cranial nerve deficit.  Skin: Skin is warm. No rash noted.  Psychiatric: She has a normal mood and affect. Her mood appears not anxious. She does not exhibit a depressed mood.  Nursing note  and vitals reviewed.     Assessment & Plan  Problem List Items Addressed This Visit    None    Visit Diagnoses    Annual physical exam    -  Primary   pap and mammo ordered by OBGYN/ has appt scheduled with them. Will draw labs   Relevant Orders   Lipid panel   Renal Function Panel   CBC with Differential/Platelet   Need for diphtheria-tetanus-pertussis (Tdap) vaccine       administered   Relevant Orders   Tdap vaccine greater than or equal to 7yo IM (Completed)   Influenza vaccine needed       administered   Relevant Orders   Flu Vaccine QUAD 6+ mos PF IM (Fluarix Quad PF) (Completed)      No orders of  the defined types were placed in this encounter.     Dr. [facility]  06/10/18

## 2018-06-11 LAB — RENAL FUNCTION PANEL
Albumin: 4.8 g/dL (ref 3.5–5.5)
BUN/Creatinine Ratio: 18 (ref 9–23)
BUN: 15 mg/dL (ref 6–24)
CO2: 22 mmol/L (ref 20–29)
CREATININE: 0.85 mg/dL (ref 0.57–1.00)
Calcium: 9.2 mg/dL (ref 8.7–10.2)
Chloride: 106 mmol/L (ref 96–106)
GFR calc Af Amer: 91 mL/min/{1.73_m2} (ref >59)
GFR, EST NON AFRICAN AMERICAN: 79 mL/min/{1.73_m2} (ref >59)
Glucose: 85 mg/dL (ref 65–99)
PHOSPHORUS: 3.2 mg/dL (ref 2.5–4.5)
POTASSIUM: 4.3 mmol/L (ref 3.5–5.2)
Sodium: 143 mmol/L (ref 134–144)

## 2018-06-11 LAB — CBC WITH DIFFERENTIAL/PLATELET
BASOS ABS: 0 10*3/uL (ref 0.0–0.2)
Basos: 0 %
EOS (ABSOLUTE): 0.1 10*3/uL (ref 0.0–0.4)
Eos: 2 %
Hematocrit: 43.9 % (ref 34.0–46.6)
Hemoglobin: 14.8 g/dL (ref 11.1–15.9)
IMMATURE GRANS (ABS): 0 10*3/uL (ref 0.0–0.1)
IMMATURE GRANULOCYTES: 0 %
LYMPHS: 49 %
Lymphocytes Absolute: 1.7 10*3/uL (ref 0.7–3.1)
MCH: 30.5 pg (ref 26.6–33.0)
MCHC: 33.7 g/dL (ref 31.5–35.7)
MCV: 91 fL (ref 79–97)
Monocytes Absolute: 0.2 10*3/uL (ref 0.1–0.9)
Monocytes: 5 %
NEUTROS PCT: 44 %
Neutrophils Absolute: 1.6 10*3/uL (ref 1.4–7.0)
PLATELETS: 213 10*3/uL (ref 150–450)
RBC: 4.85 x10E6/uL (ref 3.77–5.28)
RDW: 13.4 % (ref 12.3–15.4)
WBC: 3.6 10*3/uL (ref 3.4–10.8)

## 2018-06-11 LAB — LIPID PANEL
CHOL/HDL RATIO: 4.4 ratio (ref 0.0–4.4)
Cholesterol, Total: 215 mg/dL — ABNORMAL HIGH (ref 100–199)
HDL: 49 mg/dL (ref >39)
LDL CALC: 148 mg/dL — AB (ref 0–99)
TRIGLYCERIDES: 90 mg/dL (ref 0–149)
VLDL Cholesterol Cal: 18 mg/dL (ref 5–40)

## 2018-06-14 ENCOUNTER — Ambulatory Visit
Admission: EM | Admit: 2018-06-14 | Discharge: 2018-06-14 | Disposition: A | Discharge status: Home / Self Care | Payer: BLUE CROSS/BLUE SHIELD | Attending: Family Medicine | Admitting: Family Medicine

## 2018-06-14 DIAGNOSIS — R1033 Periumbilical pain: Secondary | ICD-10-CM | POA: Diagnosis not present

## 2018-06-14 DIAGNOSIS — R197 Diarrhea, unspecified: Secondary | ICD-10-CM | POA: Diagnosis not present

## 2018-06-14 LAB — CBC WITH DIFFERENTIAL/PLATELET
BASOS ABS: 0 10*3/uL (ref 0–0.1)
Basophils Relative: 1 %
Eosinophils Absolute: 0.1 10*3/uL (ref 0–0.7)
Eosinophils Relative: 2 %
HEMATOCRIT: 43.6 % (ref 35.0–47.0)
Hemoglobin: 14.9 g/dL (ref 12.0–16.0)
LYMPHS ABS: 1.4 10*3/uL (ref 1.0–3.6)
LYMPHS PCT: 19 %
MCH: 30.4 pg (ref 26.0–34.0)
MCHC: 34.1 g/dL (ref 32.0–36.0)
MCV: 89 fL (ref 80.0–100.0)
MONO ABS: 0.5 10*3/uL (ref 0.2–0.9)
Monocytes Relative: 7 %
NEUTROS ABS: 5.2 10*3/uL (ref 1.4–6.5)
Neutrophils Relative %: 71 %
Platelets: 184 10*3/uL (ref 150–440)
RBC: 4.9 MIL/uL (ref 3.80–5.20)
RDW: 13.1 % (ref 11.5–14.5)
WBC: 7.3 10*3/uL (ref 3.6–11.0)

## 2018-06-14 LAB — BASIC METABOLIC PANEL
ANION GAP: 9 (ref 5–15)
BUN: 17 mg/dL (ref 6–20)
CALCIUM: 9 mg/dL (ref 8.9–10.3)
CO2: 22 mmol/L (ref 22–32)
Chloride: 110 mmol/L (ref 98–111)
Creatinine, Ser: 0.86 mg/dL (ref 0.44–1.00)
Glucose, Bld: 146 mg/dL — ABNORMAL HIGH (ref 70–99)
Potassium: 3.4 mmol/L — ABNORMAL LOW (ref 3.5–5.1)
Sodium: 141 mmol/L (ref 135–145)

## 2018-06-14 NOTE — Discharge Instructions (Signed)
Over the counter tylenol as needed for pain Clear liquid diet for 24 hours then advance slowly as tolerated Go to Emergency Department if symptoms worsen or pain becomes more localized to right lower abdomen area

## 2018-06-14 NOTE — ED Triage Notes (Signed)
Pt here for abdomen pain that isn't constant located right below her belly button and does radiate to the right side. States this started a few hours ago and feels like a sharp shooting pain. Does have a hx of constipation, but did have a bowel movement today that was watery.

## 2018-06-14 NOTE — ED Provider Notes (Signed)
[facility]    CSN: [id number] Arrival date & time: 06/14/18  1732     History   Chief Complaint Chief Complaint  Patient presents with  . Abdominal Pain    HPI Diane Marks is a 52 y.o. female.   52 yo female with a c/o abdominal pain and watery diarrhea today. Denies any fevers, chills, vomiting. States felt nausea earlier today but it has since resolved. Also states she has a h/o irritable bowel syndrome and symptoms feel similar. Denies any recent travel, suspicious foods or sick contacts.   The history is provided by the patient.  Abdominal Pain    Past Medical History:  Diagnosis Date  . Hyperlipidemia   . Irritable bowel syndrome   . Vertigo    no episodes 12-18 months  . Wears contact lenses     Patient Active Problem List   Diagnosis Date Noted  . Perimenopause 10/09/2017  . Special screening for malignant neoplasms, colon   . History of tobacco use 03/02/2015  . HLD (hyperlipidemia) 03/02/2015  . 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 Routine general medical examination at a health care facility 03/01/2015  . At risk of disease 03/01/2015  . Cephalalgia 03/01/2015  . Irritable bowel syndrome 03/01/2015    Past Surgical History:  Procedure Laterality Date  . CESAREAN SECTION    . COLONOSCOPY WITH PROPOFOL N/A 08/05/2015   Procedure: COLONOSCOPY WITH PROPOFOL;  Surgeon: Midge Minium, MD;  Location: [facility];  Service: Endoscopy;  Laterality: N/A;    OB History    Gravida  4   Para  2   Term      Preterm      AB  2   Living  2     SAB      TAB  1   Ectopic  1   Multiple      Live Births               Home Medications    Prior to Admission medications   Medication Sig Start Date End Date Taking? Authorizing Provider  Black Cohosh 40 MG CAPS Take 2 capsules by mouth 2 (two) times daily.   Yes [provider]  Cholecalciferol (VITAMIN D-3 PO) Take by mouth.   Yes [provider]    Multiple Vitamin (MULTIVITAMIN) tablet Take 1 tablet by mouth daily.   Yes [provider]    Family History Family History  Problem Relation Age of Onset  . Hyperlipidemia Mother   . Diabetes Father   . Hyperlipidemia Father   . Hypertension Father   . Pancreatic cancer Father 56  . Heart disease Maternal Grandmother   . Cancer Paternal Grandmother 65       mouth  . Breast cancer Neg Hx     Social History Social History   Tobacco Use  . Smoking status: Former Games developer  . Smokeless tobacco: Never Used  . Tobacco comment: quit 20 yrs ago  Substance Use Topics  . Alcohol use: Yes    Alcohol/week: 0.0 standard drinks    Comment: rare  . Drug use: No     Allergies   Patient has no known allergies.   Review of Systems Review of Systems  Gastrointestinal: Positive for abdominal pain.     Physical Exam Triage Vital Signs ED Triage Vitals  Enc Vitals Group     BP 06/14/18 1741 133/88     Pulse Rate 06/14/18 1741 (!) 103  Resp 06/14/18 1741 18     Temp 06/14/18 1741 98.7 F (37.1 C)     Temp Source 06/14/18 1741 Oral     SpO2 06/14/18 1741 99 %     Weight 06/14/18 1744 138 lb (62.6 kg)     Height --      Head Circumference --      Peak Flow --      Pain Score 06/14/18 1744 7     Pain Loc --      Pain Edu? --      Excl. in GC? --    No data found.  Updated Vital Signs BP 133/88 (BP Location: Left Arm)   Pulse (!) 103   Temp 98.7 F (37.1 C) (Oral)   Resp 18   Wt 62.6 kg   LMP 02/23/2018   SpO2 99%   BMI 25.24 kg/m   Visual Acuity Right Eye Distance:   Left Eye Distance:   Bilateral Distance:    Right Eye Near:   Left Eye Near:    Bilateral Near:     Physical Exam  Constitutional: She appears well-developed and well-nourished. No distress.  Pulmonary/Chest: Effort normal. No respiratory distress.  Abdominal: Soft. Bowel sounds are normal. She exhibits no distension and no mass. There is tenderness (just below the umbilicus; no  rebound or guarding; NO right lower quadrant tenderness). There is no rebound and no guarding.  Skin: She is not diaphoretic.  Nursing note and vitals reviewed.    UC Treatments / Results  Labs (all labs ordered are listed, but only abnormal results are displayed) Labs Reviewed  BASIC METABOLIC PANEL - Abnormal; Notable for the following components:      Result Value   Potassium 3.4 (*)    Glucose, Bld 146 (*)    All other components within normal limits  CBC WITH DIFFERENTIAL/PLATELET  URINALYSIS, COMPLETE (UACMP) WITH MICROSCOPIC    Alvarado score: 2    EKG None  Radiology No results found.  Procedures Procedures (including critical care time)  Medications Ordered in UC Medications - No data to display  Initial Impression / Assessment and Plan / UC Course  I have reviewed the triage vital signs and the nursing notes.  Pertinent labs & imaging results that were available during my care of the patient were reviewed by me and considered in my medical decision making (see chart for details).      Final Clinical Impressions(s) / UC Diagnoses   Final diagnoses:  Acute periumbilical pain  Diarrhea, unspecified type  (likely viral gastroenteritis)    Discharge Instructions     Over the counter tylenol as needed for pain Clear liquid diet for 24 hours then advance slowly as tolerated Go to Emergency Department if symptoms worsen or pain becomes more localized to right lower abdomen area    ED Prescriptions    None      1. Lab results and diagnosis reviewed with patient 2. rx as per orders above; reviewed possible side effects, interactions, risks and benefits  3. Recommend supportive treatment as above 4. Follow-up prn if symptoms worsen or don't   improveControlled Substance Prescriptions  Controlled Substance Registry consulted? Not Applicable   Payton Mccallumonty, Keiley Levey, MD 06/14/18 2008

## 2018-06-17 LAB — URINALYSIS, COMPLETE (UACMP) WITH MICROSCOPIC
BACTERIA UA: NONE SEEN
Bilirubin Urine: NEGATIVE
Glucose, UA: NEGATIVE mg/dL
Hgb urine dipstick: NEGATIVE
KETONES UR: NEGATIVE mg/dL
LEUKOCYTES UA: NEGATIVE
Nitrite: NEGATIVE
PROTEIN: NEGATIVE mg/dL
Specific Gravity, Urine: 1.015 (ref 1.005–1.030)
WBC, UA: NONE SEEN WBC/hpf (ref 0–5)
pH: 5 (ref 5.0–8.0)

## 2019-03-05 ENCOUNTER — Other Ambulatory Visit: Payer: Self-pay | Admitting: Family Medicine

## 2019-03-05 DIAGNOSIS — Z1231 Encounter for screening mammogram for malignant neoplasm of breast: Secondary | ICD-10-CM

## 2019-03-13 ENCOUNTER — Ambulatory Visit
Admission: RE | Admit: 2019-03-13 | Discharge: 2019-03-13 | Disposition: A | Discharge status: Home / Self Care | Payer: BC Managed Care – PPO | Source: Ambulatory Visit | Attending: Family Medicine | Admitting: Family Medicine

## 2019-03-13 ENCOUNTER — Other Ambulatory Visit: Payer: Self-pay

## 2019-03-13 DIAGNOSIS — Z1231 Encounter for screening mammogram for malignant neoplasm of breast: Secondary | ICD-10-CM | POA: Insufficient documentation

## 2019-05-23 ENCOUNTER — Encounter: Payer: BC Managed Care – PPO | Admitting: Family Medicine

## 2019-05-23 ENCOUNTER — Other Ambulatory Visit: Payer: Self-pay

## 2019-05-23 ENCOUNTER — Encounter: Payer: Self-pay | Admitting: Family Medicine

## 2019-06-12 DIAGNOSIS — R7881 Bacteremia: Secondary | ICD-10-CM | POA: Insufficient documentation

## 2019-06-12 DIAGNOSIS — K5900 Constipation, unspecified: Secondary | ICD-10-CM | POA: Insufficient documentation

## 2019-06-12 DIAGNOSIS — R7401 Elevation of levels of liver transaminase levels: Secondary | ICD-10-CM | POA: Insufficient documentation

## 2019-06-12 DIAGNOSIS — K5792 Diverticulitis of intestine, part unspecified, without perforation or abscess without bleeding: Secondary | ICD-10-CM | POA: Insufficient documentation

## 2019-06-12 LAB — HIV ANTIBODY (ROUTINE TESTING W REFLEX): HIV 1&2 Ab, 4th Generation: NONREACTIVE

## 2019-06-13 ENCOUNTER — Encounter: Payer: BC Managed Care – PPO | Admitting: Family Medicine

## 2019-06-13 MED ORDER — CVS EAR DROPS OT
40.00 | OTIC | Status: DC
Start: 2019-06-14 — End: 2019-06-13

## 2019-06-13 MED ORDER — Medication
5.00 | Status: DC
Start: ? — End: 2019-06-13

## 2019-06-13 MED ORDER — Medication
1.00 | Status: DC
Start: 2019-06-13 — End: 2019-06-13

## 2019-06-13 MED ORDER — BUTALBITAL-APAP-CAFFEINE 50-325-40 MG PO TABS
1.00 | ORAL_TABLET | ORAL | Status: DC
Start: ? — End: 2019-06-13

## 2019-06-13 MED ORDER — Medication
Status: DC
Start: ? — End: 2019-06-13

## 2019-06-13 MED ORDER — VICON FORTE PO CAPS
17.00 | ORAL_CAPSULE | ORAL | Status: DC
Start: 2019-06-14 — End: 2019-06-13

## 2019-06-13 MED ORDER — NAPHAZOLINE HCL OP
2.00 | OPHTHALMIC | Status: DC
Start: ? — End: 2019-06-13

## 2019-06-13 MED ORDER — GENERIC EXTERNAL MEDICATION
Status: DC
Start: ? — End: 2019-06-13

## 2019-06-13 MED ORDER — MELATONIN 3 MG PO TABS
3.00 | ORAL_TABLET | ORAL | Status: DC
Start: ? — End: 2019-06-13

## 2019-06-13 MED ORDER — Medication
10.00 | Status: DC
Start: ? — End: 2019-06-13

## 2019-06-13 MED ORDER — DERMAGINATE DRESSING 4"X4" EX PADS
3.38 | MEDICATED_PAD | CUTANEOUS | Status: DC
Start: 2019-06-13 — End: 2019-06-13

## 2019-06-13 MED ORDER — MEDI-TUSSIN DM DOUBLE STRENGTH 30-200 MG/5ML PO LIQD
500.00 | ORAL | Status: DC
Start: ? — End: 2019-06-13

## 2019-06-13 MED ORDER — BURN RELIEF ALOE EX
200.00 | CUTANEOUS | Status: DC
Start: ? — End: 2019-06-13

## 2019-06-30 ENCOUNTER — Encounter: Payer: Self-pay | Admitting: Family Medicine

## 2019-06-30 ENCOUNTER — Ambulatory Visit (INDEPENDENT_AMBULATORY_CARE_PROVIDER_SITE_OTHER): Payer: BC Managed Care – PPO | Admitting: Family Medicine

## 2019-06-30 ENCOUNTER — Other Ambulatory Visit: Payer: Self-pay

## 2019-06-30 VITALS — BP 138/80 | HR 68 | Ht 62.0 in | Wt 134.0 lb

## 2019-06-30 DIAGNOSIS — Z09 Encounter for follow-up examination after completed treatment for conditions other than malignant neoplasm: Secondary | ICD-10-CM | POA: Diagnosis not present

## 2019-06-30 DIAGNOSIS — K5792 Diverticulitis of intestine, part unspecified, without perforation or abscess without bleeding: Secondary | ICD-10-CM | POA: Diagnosis not present

## 2019-06-30 DIAGNOSIS — R7401 Elevation of levels of liver transaminase levels: Secondary | ICD-10-CM | POA: Diagnosis not present

## 2019-06-30 DIAGNOSIS — R7881 Bacteremia: Secondary | ICD-10-CM

## 2019-06-30 NOTE — Patient Instructions (Signed)
Diverticulosis  Diverticulosis is a condition that develops when small pouches (diverticula) form in the wall of the large intestine (colon). The colon is where water is absorbed and stool is formed. The pouches form when the inside layer of the colon pushes through weak spots in the outer layers of the colon. You may have a few pouches or many of them. What are the causes? The cause of this condition is not known. What increases the risk? The following factors may make you more likely to develop this condition:  Being older than age 60. Your risk for this condition increases with age. Diverticulosis is rare among people younger than age 30. By age 80, many people have it.  Eating a low-fiber diet.  Having frequent constipation.  Being overweight.  Not getting enough exercise.  Smoking.  Taking over-the-counter pain medicines, like aspirin and ibuprofen.  Having a family history of diverticulosis. What are the signs or symptoms? In most people, there are no symptoms of this condition. If you do have symptoms, they may include:  Bloating.  Cramps in the abdomen.  Constipation or diarrhea.  Pain in the lower left side of the abdomen. How is this diagnosed? This condition is most often diagnosed during an exam for other colon problems. Because diverticulosis usually has no symptoms, it often cannot be diagnosed independently. This condition may be diagnosed by:  Using a flexible scope to examine the colon (colonoscopy).  Taking an X-ray of the colon after dye has been put into the colon (barium enema).  Doing a CT scan. How is this treated? You may not need treatment for this condition if you have never developed an infection related to diverticulosis. If you have had an infection before, treatment may include:  Eating a high-fiber diet. This may include eating more fruits, vegetables, and grains.  Taking a fiber supplement.  Taking a live bacteria supplement (probiotic).   Taking medicine to relax your colon.  Taking antibiotic medicines. Follow these instructions at home:  Drink 6-8 glasses of water or more each day to prevent constipation.  Try not to strain when you have a bowel movement.  If you have had an infection before: ? Eat more fiber as directed by your health care provider or your diet and nutrition specialist (dietitian). ? Take a fiber supplement or probiotic, if your health care provider approves.  Take over-the-counter and prescription medicines only as told by your health care provider.  If you were prescribed an antibiotic, take it as told by your health care provider. Do not stop taking the antibiotic even if you start to feel better.  Keep all follow-up visits as told by your health care provider. This is important. Contact a health care provider if:  You have pain in your abdomen.  You have bloating.  You have cramps.  You have not had a bowel movement in 3 days. Get help right away if:  Your pain gets worse.  Your bloating becomes very bad.  You have a fever or chills, and your symptoms suddenly get worse.  You vomit.  You have bowel movements that are bloody or black.  You have bleeding from your rectum. Summary  Diverticulosis is a condition that develops when small pouches (diverticula) form in the wall of the large intestine (colon).  You may have a few pouches or many of them.  This condition is most often diagnosed during an exam for other colon problems.  If you have had an infection related to   diverticulosis, treatment may include increasing the fiber in your diet, taking supplements, or taking medicines. This information is not intended to replace advice given to you by your health care provider. Make sure you discuss any questions you have with your health care provider. Document Released: 06/08/2004 Document Revised: 08/24/2017 Document Reviewed: 07/31/2016 Elsevier Patient Education  2020  Elsevier [employer]  

## 2019-06-30 NOTE — Progress Notes (Signed)
Date:  06/30/2019   Name:  Diane Marks   DOB:  Aug 01, 1966   MRN:  [id number]   Chief Complaint: Follow-up (went to ED on 9/16- d/c on 06/13/2019- had a bacteria in blood and diverticulitis)  Patient is a 53 year old female who presents for a hospital discharge exam. The patient reports the following problems: blood culture positive e coli. Health maintenance has been reviewed influenza.  Abdominal Pain This is a new problem. The current episode started 1 to 4 weeks ago (discharged 9/18). The onset quality is gradual. The problem has been gradually improving. The pain is located in the suprapubic region. The pain is moderate. The quality of the pain is colicky and cramping. Pertinent negatives include no arthralgias, constipation, diarrhea, dysuria, fever, frequency, headaches, hematochezia, hematuria, melena, myalgias, nausea or vomiting. Associated symptoms comments: chills. She has tried antibiotics for the symptoms. The treatment provided significant relief.    Review of Systems  Constitutional: Negative for fatigue, fever and unexpected weight change.  HENT: Negative for congestion, drooling, ear discharge, ear pain, rhinorrhea, sinus pressure, sneezing and sore throat.   Eyes: Negative for photophobia, pain, discharge, redness and itching.  Respiratory: Negative for cough, shortness of breath, wheezing and stridor.   Cardiovascular: Negative for chest pain, palpitations and leg swelling.  Gastrointestinal: Positive for abdominal pain. Negative for anal bleeding, blood in stool, constipation, diarrhea, hematochezia, melena, nausea and vomiting.  Endocrine: Negative for cold intolerance, heat intolerance, polydipsia, polyphagia and polyuria.  Genitourinary: Negative for dysuria, flank pain, frequency, hematuria, menstrual problem, pelvic pain, urgency, vaginal bleeding and vaginal discharge.  Musculoskeletal: Negative for arthralgias, back pain, myalgias and neck pain.  Skin:  Negative for rash.  Allergic/Immunologic: Negative for environmental allergies and food allergies.  Neurological: Negative for dizziness, weakness, light-headedness, numbness and headaches.  Hematological: Negative for adenopathy. Does not bruise/bleed easily.  Psychiatric/Behavioral: Negative for dysphoric mood and suicidal ideas. The patient is not nervous/anxious.     Patient Active Problem List   Diagnosis Date Noted  . Constipation 06/12/2019  . Diverticulitis 06/12/2019  . Gram-negative bacteremia 06/12/2019  . Transaminitis 06/12/2019  . Perimenopause 10/09/2017  . Special screening for malignant neoplasms, colon   . Fatigue 03/09/2015  . Visual disturbance 03/09/2015  . History of tobacco use 03/02/2015  . HLD (hyperlipidemia) 03/02/2015  . 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 Routine general medical examination at a health care facility 03/01/2015  . At risk of disease 03/01/2015  . Cephalalgia 03/01/2015  . Irritable bowel syndrome 03/01/2015    No Known Allergies  Past Surgical History:  Procedure Laterality Date  . CESAREAN SECTION    . COLONOSCOPY WITH PROPOFOL N/A 08/05/2015   Procedure: COLONOSCOPY WITH PROPOFOL;  Surgeon: Midge Minium, MD;  Location: [facility];  Service: Endoscopy;  Laterality: N/A;    Social History   Tobacco Use  . Smoking status: Former Games developer  . Smokeless tobacco: Never Used  . Tobacco comment: quit 20 yrs ago  Substance Use Topics  . Alcohol use: Yes    Alcohol/week: 0.0 standard drinks    Comment: rare  . Drug use: No     Medication list has been reviewed and updated.  No outpatient medications have been marked as taking for the 06/30/19 encounter (Office Visit) with Duanne Limerick, MD.    [facility] 2/9 Scores 06/30/2019 01/22/2018  PHQ - 2 Score 0 0  PHQ- 9 Score 0 0    BP Readings from Last 3 Encounters:  06/30/19  138/80  06/14/18 133/88  06/10/18 120/80    Physical Exam  Wt Readings from Last 3 Encounters:   06/30/19 134 lb (60.8 kg)  05/23/19 136 lb (61.7 kg)  06/14/18 138 lb (62.6 kg)    BP 138/80   Pulse 68   Ht 5\' 2"  (1.575 m)   Wt 134 lb (60.8 kg)   BMI 24.51 kg/m   Assessment and Plan:  1. Hospital discharge follow-up Patient is hospital discharge follow-up from [facility] for bacteremia secondary to E. coli secondary to suspected diverticulitis.  Patient has been on Cipro and metronidazole and is doing well presently.  Patient would like a follow-up labs.  2. Gram-negative bacteremia Patient was noted to have an E. coli in her blood culture.  We will check a CBC and patient would like a blood culture repeated for assurance of resolution. - CBC with Differential/Platelet - Blood culture (routine single)  3. Transaminitis Patient had elevated transaminases and has not been checked for hepatitis C even though she does not fall within the ranges would like to make sure that this is not an issue that is ongoing.  We will also check hepatic panel as well - Hepatic Function Panel (6) - Hepatitis C antibody  4. Diverticulitis Patient does have diverticulitis that was noted on CT of the abdomen.  I am uncertain if this was also noted on colonoscopy but patient is aware of this and diet will be adjusted accordingly.

## 2019-07-01 LAB — CBC WITH DIFFERENTIAL/PLATELET
Basophils Absolute: 0.1 10*3/uL (ref 0.0–0.2)
Basos: 1 %
EOS (ABSOLUTE): 0.1 10*3/uL (ref 0.0–0.4)
Eos: 2 %
Hematocrit: 46.6 % (ref 34.0–46.6)
Hemoglobin: 15.5 g/dL (ref 11.1–15.9)
Immature Grans (Abs): 0 10*3/uL (ref 0.0–0.1)
Immature Granulocytes: 0 %
Lymphocytes Absolute: 2.1 10*3/uL (ref 0.7–3.1)
Lymphs: 44 %
MCH: 29.8 pg (ref 26.6–33.0)
MCHC: 33.3 g/dL (ref 31.5–35.7)
MCV: 90 fL (ref 79–97)
Monocytes Absolute: 0.4 10*3/uL (ref 0.1–0.9)
Monocytes: 7 %
Neutrophils Absolute: 2.2 10*3/uL (ref 1.4–7.0)
Neutrophils: 46 %
Platelets: 323 10*3/uL (ref 150–450)
RBC: 5.2 x10E6/uL (ref 3.77–5.28)
RDW: 12.6 % (ref 11.7–15.4)
WBC: 4.8 10*3/uL (ref 3.4–10.8)

## 2019-07-01 LAB — HEPATIC FUNCTION PANEL (6)
ALT: 17 IU/L (ref 0–32)
AST: 16 IU/L (ref 0–40)
Albumin: 4.9 g/dL (ref 3.8–4.9)
Alkaline Phosphatase: 80 IU/L (ref 39–117)
Bilirubin Total: 0.5 mg/dL (ref 0.0–1.2)
Bilirubin, Direct: 0.11 mg/dL (ref 0.00–0.40)

## 2019-07-01 LAB — HEPATITIS C ANTIBODY: Hep C Virus Ab: <0.1 s/co ratio (ref 0.0–0.9)

## 2019-10-14 ENCOUNTER — Ambulatory Visit (INDEPENDENT_AMBULATORY_CARE_PROVIDER_SITE_OTHER): Payer: BC Managed Care – PPO | Admitting: Family Medicine

## 2019-10-14 ENCOUNTER — Encounter: Payer: Self-pay | Admitting: Family Medicine

## 2019-10-14 ENCOUNTER — Other Ambulatory Visit: Payer: Self-pay

## 2019-10-14 VITALS — BP 140/100 | HR 92 | Ht 62.0 in | Wt 138.0 lb

## 2019-10-14 DIAGNOSIS — Z23 Encounter for immunization: Secondary | ICD-10-CM

## 2019-10-14 DIAGNOSIS — R030 Elevated blood-pressure reading, without diagnosis of hypertension: Secondary | ICD-10-CM | POA: Diagnosis not present

## 2019-10-14 DIAGNOSIS — H832X9 Labyrinthine dysfunction, unspecified ear: Secondary | ICD-10-CM | POA: Diagnosis not present

## 2019-10-14 NOTE — Patient Instructions (Addendum)
DASH Eating Plan DASH stands for "Dietary Approaches to Stop Hypertension." The DASH eating plan is a healthy eating plan that has been shown to reduce high blood pressure (hypertension). It may also reduce your risk for type 2 diabetes, heart disease, and stroke. The DASH eating plan may also help with weight loss. What are tips for following this plan?  General guidelines  Avoid eating more than 2,300 mg (milligrams) of salt (sodium) a day. If you have hypertension, you may need to reduce your sodium intake to 1,500 mg a day.  Limit alcohol intake to no more than 1 drink a day for nonpregnant women and 2 drinks a day for men. One drink equals 12 oz of beer, 5 oz of wine, or 1 oz of hard liquor.  Work with your health care provider to maintain a healthy body weight or to lose weight. Ask what an ideal weight is for you.  Get at least 30 minutes of exercise that causes your heart to beat faster (aerobic exercise) most days of the week. Activities may include walking, swimming, or biking.  Work with your health care provider or diet and nutrition specialist (dietitian) to adjust your eating plan to your individual calorie needs. Reading food labels   Check food labels for the amount of sodium per serving. Choose foods with less than 5 percent of the Daily Value of sodium. Generally, foods with less than 300 mg of sodium per serving fit into this eating plan.  To find whole grains, look for the word "whole" as the first word in the ingredient list. Shopping  Buy products labeled as "low-sodium" or "no salt added."  Buy fresh foods. Avoid canned foods and premade or frozen meals. Cooking  Avoid adding salt when cooking. Use salt-free seasonings or herbs instead of table salt or sea salt. Check with your health care provider or pharmacist before using salt substitutes.  Do not fry foods. Cook foods using healthy methods such as baking, boiling, grilling, and broiling instead.  Cook with  heart-healthy oils, such as olive, canola, soybean, or sunflower oil. Meal planning  Eat a balanced diet that includes: ? 5 or more servings of fruits and vegetables each day. At each meal, try to fill half of your plate with fruits and vegetables. ? Up to 6-8 servings of whole grains each day. ? Less than 6 oz of lean meat, poultry, or fish each day. A 3-oz serving of meat is about the same size as a deck of cards. One egg equals 1 oz. ? 2 servings of low-fat dairy each day. ? A serving of nuts, seeds, or beans 5 times each week. ? Heart-healthy fats. Healthy fats called Omega-3 fatty acids are found in foods such as flaxseeds and coldwater fish, like sardines, salmon, and mackerel.  Limit how much you eat of the following: ? Canned or prepackaged foods. ? Food that is high in trans fat, such as fried foods. ? Food that is high in saturated fat, such as fatty meat. ? Sweets, desserts, sugary drinks, and other foods with added sugar. ? Full-fat dairy products.  Do not salt foods before eating.  Try to eat at least 2 vegetarian meals each week.  Eat more home-cooked food and less restaurant, buffet, and fast food.  When eating at a restaurant, ask that your food be prepared with less salt or no salt, if possible. What foods are recommended? The items listed may not be a complete list. Talk with your dietitian about   what dietary choices are best for you. Grains Whole-grain or whole-wheat bread. Whole-grain or whole-wheat pasta. Brown rice. Oatmeal. Quinoa. Bulgur. Whole-grain and low-sodium cereals. Pita bread. Low-fat, low-sodium crackers. Whole-wheat flour tortillas. Vegetables Fresh or frozen vegetables (raw, steamed, roasted, or grilled). Low-sodium or reduced-sodium tomato and vegetable juice. Low-sodium or reduced-sodium tomato sauce and tomato paste. Low-sodium or reduced-sodium canned vegetables. Fruits All fresh, dried, or frozen fruit. Canned fruit in natural juice (without  added sugar). Meat and other protein foods Skinless chicken or turkey. Ground chicken or turkey. Pork with fat trimmed off. Fish and seafood. Egg whites. Dried beans, peas, or lentils. Unsalted nuts, nut butters, and seeds. Unsalted canned beans. Lean cuts of beef with fat trimmed off. Low-sodium, lean deli meat. Dairy Low-fat (1%) or fat-free (skim) milk. Fat-free, low-fat, or reduced-fat cheeses. Nonfat, low-sodium ricotta or cottage cheese. Low-fat or nonfat yogurt. Low-fat, low-sodium cheese. Fats and oils Soft margarine without trans fats. Vegetable oil. Low-fat, reduced-fat, or light mayonnaise and salad dressings (reduced-sodium). Canola, safflower, olive, soybean, and sunflower oils. Avocado. Seasoning and other foods Herbs. Spices. Seasoning mixes without salt. Unsalted popcorn and pretzels. Fat-free sweets. What foods are not recommended? The items listed may not be a complete list. Talk with your dietitian about what dietary choices are best for you. Grains Baked goods made with fat, such as croissants, muffins, or some breads. Dry pasta or rice meal packs. Vegetables Creamed or fried vegetables. Vegetables in a cheese sauce. Regular canned vegetables (not low-sodium or reduced-sodium). Regular canned tomato sauce and paste (not low-sodium or reduced-sodium). Regular tomato and vegetable juice (not low-sodium or reduced-sodium). Pickles. Olives. Fruits Canned fruit in a light or heavy syrup. Fried fruit. Fruit in cream or butter sauce. Meat and other protein foods Fatty cuts of meat. Ribs. Fried meat. Bacon. Sausage. Bologna and other processed lunch meats. Salami. Fatback. Hotdogs. Bratwurst. Salted nuts and seeds. Canned beans with added salt. Canned or smoked fish. Whole eggs or egg yolks. Chicken or turkey with skin. Dairy Whole or 2% milk, cream, and half-and-half. Whole or full-fat cream cheese. Whole-fat or sweetened yogurt. Full-fat cheese. Nondairy creamers. Whipped toppings.  Processed cheese and cheese spreads. Fats and oils Butter. Stick margarine. Lard. Shortening. Ghee. Bacon fat. Tropical oils, such as coconut, palm kernel, or palm oil. Seasoning and other foods Salted popcorn and pretzels. Onion salt, garlic salt, seasoned salt, table salt, and sea salt. Worcestershire sauce. Tartar sauce. Barbecue sauce. Teriyaki sauce. Soy sauce, including reduced-sodium. Steak sauce. Canned and packaged gravies. Fish sauce. Oyster sauce. Cocktail sauce. Horseradish that you find on the shelf. Ketchup. Mustard. Meat flavorings and tenderizers. Bouillon cubes. Hot sauce and Tabasco sauce. Premade or packaged marinades. Premade or packaged taco seasonings. Relishes. Regular salad dressings. Where to find more information:  National Heart, Lung, and Blood Institute: www.nhlbi.nih.gov  American Heart Association: www.heart.org Summary  The DASH eating plan is a healthy eating plan that has been shown to reduce high blood pressure (hypertension). It may also reduce your risk for type 2 diabetes, heart disease, and stroke.  With the DASH eating plan, you should limit salt (sodium) intake to 2,300 mg a day. If you have hypertension, you may need to reduce your sodium intake to 1,500 mg a day.  When on the DASH eating plan, aim to eat more fresh fruits and vegetables, whole grains, lean proteins, low-fat dairy, and heart-healthy fats.  Work with your health care provider or diet and nutrition specialist (dietitian) to adjust your eating plan to your   individual calorie needs. This information is not intended to replace advice given to you by your health care provider. Make sure you discuss any questions you have with your health care provider. Document Revised: 08/24/2017 Document Reviewed: 09/04/2016 Elsevier Patient Education  [place]. Labyrinthitis  Labyrinthitis is an inner ear infection. The inner ear is a system of tubes and canals (labyrinth) that are filled with  fluid. The inner ear also contains nerve cells that send hearing and balance signals to the brain. When tiny germs (microorganisms) get inside the labyrinth, they harm the cells that send messages to the brain. This can cause changes in hearing and balance. Labyrinthitis usually develops suddenly and goes away with treatment in a few weeks (acute labyrinthitis). If the infection damages parts of the labyrinth, some symptoms may last for a long time (chronic labyrinthitis). What are the causes? Labyrinthitis is most often caused by a virus, such as one that causes:  Infectious mononucleosis, also called "mono."  Measles.  The flu.  Herpes. Labyrinthitis can also be caused by bacteria that spread from an infection in the brain or the middle ear (suppurative labyrinthitis). In some cases, the bacteria may produce a poison (toxin) that gets inside the labyrinth (serous labyrinthitis). What increases the risk? You may be at greater risk for labyrinthitis if you:  Recently had a mouth, nose, or throat infection (upper respiratory infection) or an ear infection.  Drink a lot of alcohol.  Smoke.  Use certain drugs.  Are not well-rested (are fatigued).  Are experiencing a lot of stress.  Have allergies. What are the signs or symptoms? Symptoms of labyrinthitis usually start suddenly. The symptoms may range from mild to severe, and may include:  Dizziness.  Hearing loss.  A feeling that you or your surroundings are moving when they are not (vertigo).  Ringing in your ear (tinnitus).  Nausea and vomiting.  Trouble focusing your eyes. Symptoms of chronic labyrinthitis may include:  Fatigue.  Confusion.  Hearing loss.  Tinnitus.  Poor balance.  Vertigo after sudden head movements. How is this diagnosed? This condition may be diagnosed based on:  Your symptoms and medical history. Your health care provider may ask about any dizziness or hearing loss you have and any  recent upper respiratory infections.  A physical exam that involves: ? Checking your ears for infection. ? Testing your balance. ? Checking your eye movement.  Hearing tests.  Imaging tests, such as CT scan or MRI.  Tests of your eye movements (electronystagmogram, ENG). How is this treated?  Treatment depends on the cause. If your condition is caused by bacteria, you may need antibiotic medicine. If it is caused by a virus, it may get better on its own. Regardless of the cause, you may be treated with:  Medicines to: ? Stop dizziness. ? Relieve nausea. ? Reduce inflammation. ? Speed up your recovery.  Rest. You may be asked to limit your activities until dizziness goes away.  IV fluids. These may be given at a hospital. You may need IV fluids if you have severe nausea and vomiting.  Physical therapy. A therapist can teach you exercises to help you adjust to feeling dizzy (vestibular rehabilitation exercises). You may need this if you have dizziness that does not go away. Follow these instructions at home: Medicines  Take over-the-counter and prescription medicines only as told by your health care provider.  If you were prescribed an antibiotic medicine, take it exactly as told by your health care provider.  Do not stop taking the antibiotic even if you start to feel better. Activity  Rest as much as possible.  Limit your activity as directed. Ask your health care provider what activities are safe for you.  Do not make sudden movements until any dizziness goes away.  If physical therapy was prescribed, do exercises as directed. General instructions  Avoid loud noises and bright lights.  Do not drive until your health care provider says that this is safe for you.  Drink enough fluid to keep your urine pale yellow.  Keep all follow-up visits as told by your health care provider. This is important. Contact a health care provider if you have:  Symptoms that do not get  better with medicine.  Symptoms that last longer than two weeks.  A fever. Get help right away if you have:  Nausea or vomiting that is severe or does not go away.  Severe dizziness.  Sudden hearing loss. Summary  Labyrinthitis is an infection of the inner ear. It can cause changes in hearing and balance.  Symptoms usually start suddenly and include dizziness, hearing loss, and nausea and vomiting. You may also have ringing in your ear (tinnitus), trouble focusing your eyes, and a feeling that you or your surroundings are moving when they are not (vertigo).  If the condition lasts more than a few weeks, symptoms may include fatigue, confusion, hearing loss, poor balance, tinnitus, and vertigo.  Treatment depends on the cause. If your labyrinthitis is caused by bacteria, you may need antibiotic medicine. If your labyrinthitis is caused by a virus, it may get better on its own.  Follow your health care provider's instructions, including how to take medicines, what activities to avoid, and when to get medical help. This information is not intended to replace advice given to you by your health care provider. Make sure you discuss any questions you have with your health care provider. Document Revised: 07/01/2018 Document Reviewed: 09/22/2017 Elsevier Patient Education  2020 [employer].

## 2019-10-14 NOTE — Progress Notes (Signed)
Date:  10/14/2019   Name:  Diane Marks   DOB:  [birth year]   MRN:  [id number]   Chief Complaint: Dizziness (when walking and looking down or riding in a car looking down- starts to feel nauseated and dizzy. Was given meclizine in the hosp last year, but was told not to take it due to the bacteria in the body.) and Flu Vaccine  Dizziness This is a recurrent (earlier case with 10 month recurrence) problem. The current episode started more than 1 month ago. The problem occurs 2 to 4 times per day (several times a day). The problem has been waxing and waning. Associated symptoms include headaches, nausea, vertigo and a visual change. Pertinent negatives include no congestion, fever or numbness. Treatments tried: epley maneuvers. The treatment provided mild relief.    Lab Results  Component Value Date   CREATININE 0.86 06/14/2018   BUN 17 06/14/2018   NA 141 06/14/2018   K 3.4 (L) 06/14/2018   CL 110 06/14/2018   CO2 22 06/14/2018   Lab Results  Component Value Date   CHOL 215 (H) 06/10/2018   HDL 49 06/10/2018   LDLCALC 148 (H) 06/10/2018   TRIG 90 06/10/2018   CHOLHDL 4.4 06/10/2018   Lab Results  Component Value Date   TSH 3.080 08/13/2017   No results found for: HGBA1C   Review of Systems  Constitutional: Negative for fever.  HENT: Positive for hearing loss. Negative for congestion, ear discharge, ear pain, sinus pressure and tinnitus.   Gastrointestinal: Positive for nausea.  Neurological: Positive for dizziness, vertigo and headaches. Negative for numbness.       Bitemporal    Patient Active Problem List   Diagnosis Date Noted  . Constipation 06/12/2019  . Diverticulitis 06/12/2019  . Gram-negative bacteremia 06/12/2019  . Transaminitis 06/12/2019  . Perimenopause 10/09/2017  . Special screening for malignant neoplasms, colon   . Fatigue 03/09/2015  . Visual disturbance 03/09/2015  . History of tobacco use 03/02/2015  . HLD (hyperlipidemia) 03/02/2015    . 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 Routine general medical examination at a health care facility 03/01/2015  . At risk of disease 03/01/2015  . Cephalalgia 03/01/2015  . Irritable bowel syndrome 03/01/2015    No Known Allergies  Past Surgical History:  Procedure Laterality Date  . CESAREAN SECTION    . COLONOSCOPY WITH PROPOFOL N/A 08/05/2015   Procedure: COLONOSCOPY WITH PROPOFOL;  Surgeon: Midge Minium, MD;  Location: [facility];  Service: Endoscopy;  Laterality: N/A;    Social History   Tobacco Use  . Smoking status: Former Games developer  . Smokeless tobacco: Never Used  . Tobacco comment: quit 20 yrs ago  Substance Use Topics  . Alcohol use: Yes    Alcohol/week: 0.0 standard drinks    Comment: rare  . Drug use: No     Medication list has been reviewed and updated.  Current Meds  Medication Sig  . Cholecalciferol (VITAMIN D-3 PO) Take by mouth.  . Multiple Vitamin (MULTIVITAMIN) tablet Take 1 tablet by mouth daily.    PHQ 2/9 Scores 10/14/2019 06/30/2019 01/22/2018  PHQ - 2 Score 0 0 0  PHQ- 9 Score 0 0 0    BP Readings from Last 3 Encounters:  10/14/19 (!) 140/100  06/30/19 138/80  06/14/18 133/88    Physical Exam Vitals and nursing note reviewed.  Constitutional:      General: She is not in acute distress.    Appearance: She is not  diaphoretic.  HENT:     Head: Normocephalic and atraumatic.     Right Ear: Tympanic membrane, ear canal and external ear normal. No swelling. No middle ear effusion. There is no impacted cerumen. Tympanic membrane is not retracted.     Left Ear: Tympanic membrane, ear canal and external ear normal. No swelling.  No middle ear effusion. There is no impacted cerumen. Tympanic membrane is not retracted.     Nose: Nose normal. No congestion or rhinorrhea.     Mouth/Throat:     Mouth: Mucous membranes are moist.  Eyes:     General:        Right eye: No discharge.        Left eye: No discharge.     Conjunctiva/sclera:  Conjunctivae normal.     Pupils: Pupils are equal, round, and reactive to light.  Neck:     Thyroid: No thyromegaly.     Vascular: No JVD.  Cardiovascular:     Rate and Rhythm: Normal rate and regular rhythm.     Heart sounds: Normal heart sounds. No murmur. No friction rub. No gallop.   Pulmonary:     Effort: Pulmonary effort is normal.     Breath sounds: Normal breath sounds. No wheezing or rhonchi.  Abdominal:     General: Bowel sounds are normal.     Palpations: Abdomen is soft. There is no mass.     Tenderness: There is no abdominal tenderness. There is no guarding.  Musculoskeletal:        General: Normal range of motion.     Cervical back: Normal range of motion and neck supple.  Lymphadenopathy:     Cervical: No cervical adenopathy.  Skin:    General: Skin is warm and dry.  Neurological:     Mental Status: She is alert.     Cranial Nerves: Cranial nerves are intact. No cranial nerve deficit or facial asymmetry.     Deep Tendon Reflexes: Reflexes are normal and symmetric.     Wt Readings from Last 3 Encounters:  10/14/19 138 lb (62.6 kg)  06/30/19 134 lb (60.8 kg)  05/23/19 136 lb (61.7 kg)    BP (!) 140/100   Pulse 92   Ht 5\' 2"  (1.575 m)   Wt 138 lb (62.6 kg)   BMI 25.24 kg/m   Assessment and Plan:  1. Labyrinthine dysfunction, unspecified laterality Patient has an episode of vertigo several years ago which was particularly difficult to control and eventually it resolved.  This current episode began in February of last year and has been persistent to the point that it is daily several times a day lasting a few minutes and it is triggered by flexing the neck or turning to 1 side or the other there is some nausea associated with it there is no tinnitus but there is some mild hearing loss per others around her and vertigo.  It was discussed with patient about the means that she has tried self Epley maneuvers without relief she is unable to take Dramamine due to the  sedation effect she was told not to take meclizine but this was certainly because of sedation as well as well.  Given the recurrence and the persistent nature of this we will refer to ear nose and throat for evaluation and treatment in the meantime I have suggested that the patient trial Zyrtec which is most similar to the second-generation antihistamines to Benadryl with the hopes that may be that this would in  May for her to be relatively functional without too much sedation. - Ambulatory referral to ENT  2. Elevated blood pressure, situational Patient had elevated blood pressure reading on repeat it was 136/96 we will not initiate medication but we will give a DASH diet with reduced sodium which may help the vertigo.

## 2019-11-06 ENCOUNTER — Other Ambulatory Visit: Payer: Self-pay

## 2019-11-06 ENCOUNTER — Ambulatory Visit (INDEPENDENT_AMBULATORY_CARE_PROVIDER_SITE_OTHER): Payer: BC Managed Care – PPO | Admitting: Family Medicine

## 2019-11-06 ENCOUNTER — Encounter: Payer: Self-pay | Admitting: Family Medicine

## 2019-11-06 ENCOUNTER — Telehealth: Payer: Self-pay

## 2019-11-06 VITALS — BP 138/98 | HR 84 | Ht 62.0 in | Wt 137.0 lb

## 2019-11-06 DIAGNOSIS — R030 Elevated blood-pressure reading, without diagnosis of hypertension: Secondary | ICD-10-CM

## 2019-11-06 DIAGNOSIS — R5381 Other malaise: Secondary | ICD-10-CM

## 2019-11-06 DIAGNOSIS — R5383 Other fatigue: Secondary | ICD-10-CM | POA: Diagnosis not present

## 2019-11-06 DIAGNOSIS — E785 Hyperlipidemia, unspecified: Secondary | ICD-10-CM

## 2019-11-06 MED ORDER — HYDROCHLOROTHIAZIDE 12.5 MG PO CAPS: 12.5000 mg | ORAL_CAPSULE | Freq: Every day | ORAL | 1 refills | Status: DC

## 2019-11-06 NOTE — Progress Notes (Signed)
Date:  11/06/2019   Name:  Diane Marks   DOB:  21-Jun-1966   MRN:  [id number]   Chief Complaint: Hypertension (running in the 90s on the bottom at home)  "just don't feel right"  Hypertension This is a chronic problem. The current episode started more than 1 month ago. The problem is unchanged. The problem is uncontrolled. Associated symptoms include anxiety, malaise/fatigue and sweats. Pertinent negatives include no blurred vision, chest pain, headaches, neck pain, orthopnea, palpitations, peripheral edema, PND or shortness of breath. There are no associated agents to hypertension. Past treatments include lifestyle changes. The current treatment provides mild improvement. There are no compliance problems.  There is no history of angina, kidney disease, CAD/MI, CVA, heart failure, left ventricular hypertrophy, PVD or retinopathy. There is no history of chronic renal disease, a hypertension causing med or renovascular disease.    Lab Results  Component Value Date   CREATININE 0.86 06/14/2018   BUN 17 06/14/2018   NA 141 06/14/2018   K 3.4 (L) 06/14/2018   CL 110 06/14/2018   CO2 22 06/14/2018   Lab Results  Component Value Date   CHOL 215 (H) 06/10/2018   HDL 49 06/10/2018   LDLCALC 148 (H) 06/10/2018   TRIG 90 06/10/2018   CHOLHDL 4.4 06/10/2018   Lab Results  Component Value Date   TSH 3.080 08/13/2017   No results found for: HGBA1C   Review of Systems  Constitutional: Positive for diaphoresis, fatigue and malaise/fatigue. Negative for chills, fever and unexpected weight change.  HENT: Negative for congestion, ear discharge, ear pain, nosebleeds, rhinorrhea, sinus pressure, sneezing and sore throat.   Eyes: Negative for blurred vision, photophobia, pain, discharge, redness and itching.  Respiratory: Negative for cough, shortness of breath, wheezing and stridor.   Cardiovascular: Negative for chest pain, palpitations, orthopnea, leg swelling and PND.    Gastrointestinal: Positive for nausea. Negative for abdominal pain, blood in stool, constipation, diarrhea and vomiting.  Endocrine: Positive for cold intolerance. Negative for heat intolerance, polydipsia, polyphagia and polyuria.  Genitourinary: Positive for menstrual problem. Negative for difficulty urinating, dysuria, flank pain, frequency, hematuria, pelvic pain, urgency, vaginal bleeding and vaginal discharge.       Amenorrhea  Musculoskeletal: Positive for back pain. Negative for arthralgias, myalgias and neck pain.       Sciatica  Skin: Negative for color change, pallor, rash and wound.  Allergic/Immunologic: Negative for environmental allergies and food allergies.  Neurological: Positive for light-headedness. Negative for dizziness, tremors, seizures, syncope, speech difficulty, weakness, numbness and headaches.  Hematological: Negative for adenopathy. Does not bruise/bleed easily.  Psychiatric/Behavioral: Negative for dysphoric mood and suicidal ideas. The patient is nervous/anxious.     Patient Active Problem List   Diagnosis Date Noted  . Constipation 06/12/2019  . Diverticulitis 06/12/2019  . Gram-negative bacteremia 06/12/2019  . Transaminitis 06/12/2019  . Perimenopause 10/09/2017  . Special screening for malignant neoplasms, colon   . Fatigue 03/09/2015  . Visual disturbance 03/09/2015  . History of tobacco use 03/02/2015  . HLD (hyperlipidemia) 03/02/2015  . 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 Routine general medical examination at a health care facility 03/01/2015  . At risk of disease 03/01/2015  . Cephalalgia 03/01/2015  . Irritable bowel syndrome 03/01/2015    No Known Allergies  Past Surgical History:  Procedure Laterality Date  . CESAREAN SECTION    . COLONOSCOPY WITH PROPOFOL N/A 08/05/2015   Procedure: COLONOSCOPY WITH PROPOFOL;  Surgeon: Midge Minium, MD;  Location: [facility];  Service: Endoscopy;  Laterality: N/A;    Social History   Tobacco  Use  . Smoking status: Former Games developer  . Smokeless tobacco: Never Used  . Tobacco comment: quit 20 yrs ago  Substance Use Topics  . Alcohol use: Yes    Alcohol/week: 0.0 standard drinks    Comment: rare  . Drug use: No     Medication list has been reviewed and updated.  Current Meds  Medication Sig  . Cholecalciferol (VITAMIN D-3 PO) Take by mouth.  . Multiple Vitamin (MULTIVITAMIN) tablet Take 1 tablet by mouth daily.    PHQ 2/9 Scores 11/06/2019 10/14/2019 06/30/2019 01/22/2018  PHQ - 2 Score 0 0 0 0  PHQ- 9 Score 0 0 0 0    BP Readings from Last 3 Encounters:  11/06/19 (!) 138/98  10/14/19 (!) 140/100  06/30/19 138/80    Physical Exam Vitals and nursing note reviewed.  Constitutional:      General: She is not in acute distress.    Appearance: She is not diaphoretic.  HENT:     Head: Normocephalic and atraumatic.     Right Ear: Tympanic membrane, ear canal and external ear normal.     Left Ear: Tympanic membrane, ear canal and external ear normal.     Nose: Nose normal.  Eyes:     General:        Right eye: No discharge.        Left eye: No discharge.     Conjunctiva/sclera: Conjunctivae normal.     Pupils: Pupils are equal, round, and reactive to light.  Neck:     Thyroid: No thyromegaly.     Vascular: No JVD.  Cardiovascular:     Rate and Rhythm: Normal rate and regular rhythm.     Heart sounds: Normal heart sounds. No murmur. No friction rub. No gallop.   Pulmonary:     Effort: Pulmonary effort is normal.     Breath sounds: Normal breath sounds.  Abdominal:     General: Bowel sounds are normal.     Palpations: Abdomen is soft. There is no mass.     Tenderness: There is no abdominal tenderness. There is no guarding.  Musculoskeletal:        General: Normal range of motion.     Cervical back: Normal range of motion and neck supple.  Lymphadenopathy:     Cervical: No cervical adenopathy.  Skin:    General: Skin is warm and dry.  Neurological:      Mental Status: She is alert.     Deep Tendon Reflexes: Reflexes are normal and symmetric.     Wt Readings from Last 3 Encounters:  11/06/19 131 lb (59.4 kg)  10/14/19 138 lb (62.6 kg)  06/30/19 134 lb (60.8 kg)    BP (!) 138/98   Pulse 84   Ht 5\' 2"  (1.575 m)   Wt 131 lb (59.4 kg)   BMI 23.96 kg/m   Assessment and Plan:  1. Fatigue, unspecified type Patient's had increasing fatigue over the past couple months.  This is been in place since her admission for possible gram-negative sepsis secondary to diverticulosis.  Although patient has recovered from this she has not been "the same since ".  We will initiate the remainder for what would be a fatigue work-up with comprehensive metabolic panel specifically looking at the calcium that was slightly elevated thyroid function panel to evaluate for either hyper or hypothyroidism and a sed rate given that the family has  a history of rheumatic diseases. - Comprehensive metabolic panel - Thyroid Panel With TSH - Sedimentation rate  2. Elevated blood pressure, situational Patient has had persistent of diastolic elevated blood pressure both at the office as well as at home measured by cuff.  Will initiate hydrochlorothiazide 12.5 mg once a day and will recheck in 4 weeks. - Comprehensive metabolic panel - Thyroid Panel With TSH  3. Malaise Malaise is similar to fatigue and will continue with metabolic work-up. - Comprehensive metabolic panel - Thyroid Panel With TSH - Sedimentation rate  4. Hyperlipidemia, unspecified hyperlipidemia type Chronic.  Controlled.  Stable.  Treated with diet..  Will check lipid panel. - Lipid Panel With LDL/HDL Ratio

## 2019-11-06 NOTE — Telephone Encounter (Signed)
Pt came in.

## 2019-11-07 LAB — COMPREHENSIVE METABOLIC PANEL
ALT: 21 IU/L (ref 0–32)
AST: 25 IU/L (ref 0–40)
Albumin/Globulin Ratio: 2.2 (ref 1.2–2.2)
Albumin: 4.9 g/dL (ref 3.8–4.9)
Alkaline Phosphatase: 74 IU/L (ref 39–117)
BUN/Creatinine Ratio: 15 (ref 9–23)
BUN: 14 mg/dL (ref 6–24)
Bilirubin Total: 0.4 mg/dL (ref 0.0–1.2)
CO2: 23 mmol/L (ref 20–29)
Calcium: 10.4 mg/dL — ABNORMAL HIGH (ref 8.7–10.2)
Chloride: 105 mmol/L (ref 96–106)
Creatinine, Ser: 0.91 mg/dL (ref 0.57–1.00)
GFR calc Af Amer: 83 mL/min/{1.73_m2} (ref >59)
GFR calc non Af Amer: 72 mL/min/{1.73_m2} (ref >59)
Globulin, Total: 2.2 g/dL (ref 1.5–4.5)
Glucose: 91 mg/dL (ref 65–99)
Potassium: 4.7 mmol/L (ref 3.5–5.2)
Sodium: 141 mmol/L (ref 134–144)
Total Protein: 7.1 g/dL (ref 6.0–8.5)

## 2019-11-07 LAB — LIPID PANEL WITH LDL/HDL RATIO
Cholesterol, Total: 254 mg/dL — ABNORMAL HIGH (ref 100–199)
HDL: 53 mg/dL (ref >39)
LDL Chol Calc (NIH): 183 mg/dL — ABNORMAL HIGH (ref 0–99)
LDL/HDL Ratio: 3.5 ratio — ABNORMAL HIGH (ref 0.0–3.2)
Triglycerides: 101 mg/dL (ref 0–149)
VLDL Cholesterol Cal: 18 mg/dL (ref 5–40)

## 2019-11-07 LAB — THYROID PANEL WITH TSH
Free Thyroxine Index: 2.1 (ref 1.2–4.9)
T3 Uptake Ratio: 24 % (ref 24–39)
T4, Total: 8.8 ug/dL (ref 4.5–12.0)
TSH: 1.45 u[IU]/mL (ref 0.450–4.500)

## 2019-11-07 LAB — SEDIMENTATION RATE: Sed Rate: 2 mm/hr (ref 0–40)

## 2019-11-10 ENCOUNTER — Other Ambulatory Visit: Payer: Self-pay

## 2019-11-10 ENCOUNTER — Telehealth: Payer: Self-pay

## 2019-11-10 DIAGNOSIS — F419 Anxiety disorder, unspecified: Secondary | ICD-10-CM

## 2019-11-10 MED ORDER — HYDROXYZINE HCL 10 MG PO TABS: 10.0000 mg | ORAL_TABLET | Freq: Three times a day (TID) | ORAL | 0 refills | Status: DC | PRN

## 2019-11-10 MED ORDER — ATORVASTATIN CALCIUM 10 MG PO TABS: 10.0000 mg | ORAL_TABLET | Freq: Every day | ORAL | 0 refills | Status: DC

## 2019-11-10 NOTE — Telephone Encounter (Signed)
Discussed labs sent in Atorvastatin 10 mg. Mailed Diet sheet. Will recheck labs in 6 weeks fasting and KEEP 11/25/2019 appt as well. Also started Hydroxyzine for Anxiety.

## 2019-11-10 NOTE — Patient Instructions (Signed)

## 2019-11-25 ENCOUNTER — Ambulatory Visit: Payer: BC Managed Care – PPO | Admitting: Family Medicine

## 2019-11-27 NOTE — Progress Notes (Addendum)
Duanne Limerick, MD   Chief Complaint  Patient presents with  . Amenorrhea    hot flashes, dizziness, anxiety    HPI:      [name] is a 54 y.o. [phone] who LMP was No LMP recorded. Patient is perimenopausal., presents today for severe dizziness/vertigo sx for the past yr. Initially started with turning head from side to side. Now can happen with just walking. Once sx start, they continue. Happening daily. Has lost balance with sx. Does get sx with turning head with lying down. Has tried meds with PCP but they make her too tired. She would also like to know what is causing sx. Pt's LMP was 11/19. Sx started 3/20 and have persisted. Pt concerned may be related to menopause. Has seen ENT with neg eval. Doing vertigo exercises at home without relief. Had normal eye exam fall 2020. Very frustrated and tearful about sx. Hx of vertigo in past but didn't last this long and wasn't this bad. Recently started on BP med without sx change to sx. PCP suggested checking pituitary gland.  Has tolerable vasomotor sx, and anxiety due to not feeling well with dizziness. Takes atarax prn anxiety with some sx relief.  Last annual 1/19   Past Medical History:  Diagnosis Date  . Diverticulitis   . Hyperlipidemia   . Irritable bowel syndrome   . Vertigo    no episodes 12-18 months  . Wears contact lenses     Past Surgical History:  Procedure Laterality Date  . CESAREAN SECTION    . COLONOSCOPY WITH PROPOFOL N/A 08/05/2015   Procedure: COLONOSCOPY WITH PROPOFOL;  Surgeon: Midge Minium, MD;  Location: [facility];  Service: Endoscopy;  Laterality: N/A;    Family History  Problem Relation Age of Onset  . Hyperlipidemia Mother   . Diabetes Father   . Hyperlipidemia Father   . Hypertension Father   . Pancreatic cancer Father 83  . Heart disease Maternal Grandmother   . Cancer Paternal Grandmother 65       mouth  . Breast cancer Neg Hx     Social History   Socioeconomic  History  . Marital status: Married    Spouse name: Not on file  . Number of children: Not on file  . Years of education: Not on file  . Highest education level: Not on file  Occupational History  . Not on file  Tobacco Use  . Smoking status: Former Games developer  . Smokeless tobacco: Never Used  . Tobacco comment: quit 20 yrs ago  Substance and Sexual Activity  . Alcohol use: Yes    Alcohol/week: 0.0 standard drinks    Comment: rare  . Drug use: No  . Sexual activity: Yes    Birth control/protection: None  Other Topics Concern  . Not on file  Social History Narrative  . Not on file   Social Determinants of Health   Financial Resource Strain:   . Difficulty of Paying Living Expenses: Not on file  Food Insecurity:   . Worried About Programme researcher, broadcasting/film/video in the Last Year: Not on file  . Ran Out of Food in the Last Year: Not on file  Transportation Needs:   . Lack of Transportation (Medical): Not on file  . Lack of Transportation (Non-Medical): Not on file  Physical Activity:   . Days of Exercise per Week: Not on file  . Minutes of Exercise per Session: Not on file  Stress:   .  Feeling of Stress : Not on file  Social Connections:   . Frequency of Communication with Friends and Family: Not on file  . Frequency of Social Gatherings with Friends and Family: Not on file  . Attends Religious Services: Not on file  . Active Member of Clubs or Organizations: Not on file  . Attends Banker Meetings: Not on file  . Marital Status: Not on file  Intimate Partner Violence:   . Fear of Current or Ex-Partner: Not on file  . Emotionally Abused: Not on file  . Physically Abused: Not on file  . Sexually Abused: Not on file    Outpatient Medications Prior to Visit  Medication Sig Dispense Refill  . atorvastatin (LIPITOR) 10 MG tablet Take 1 tablet (10 mg total) by mouth daily. 30 tablet 0  . Cholecalciferol (VITAMIN D-3 PO) Take by mouth.    . hydrochlorothiazide (MICROZIDE)  12.5 MG capsule Take 1 capsule (12.5 mg total) by mouth daily. 30 capsule 1  . hydrOXYzine (ATARAX/VISTARIL) 10 MG tablet Take 1 tablet (10 mg total) by mouth 3 (three) times daily as needed. 30 tablet 0  . Multiple Vitamin (MULTIVITAMIN) tablet Take 1 tablet by mouth daily.     No facility-administered medications prior to visit.      ROS:  Review of Systems  Constitutional: Negative for fatigue, fever and unexpected weight change.  Respiratory: Negative for cough, shortness of breath and wheezing.   Cardiovascular: Negative for chest pain, palpitations and leg swelling.  Gastrointestinal: Negative for blood in stool, constipation, diarrhea, nausea and vomiting.  Endocrine: Negative for cold intolerance, heat intolerance and polyuria.  Genitourinary: Negative for dyspareunia, dysuria, flank pain, frequency, genital sores, hematuria, menstrual problem, pelvic pain, urgency, vaginal bleeding, vaginal discharge and vaginal pain.  Musculoskeletal: Negative for back pain, joint swelling and myalgias.  Skin: Negative for rash.  Neurological: Positive for dizziness and light-headedness. Negative for syncope, numbness and headaches.  Hematological: Negative for adenopathy.  Psychiatric/Behavioral: Positive for agitation. Negative for confusion, sleep disturbance and suicidal ideas. The patient is not nervous/anxious.     OBJECTIVE:   Vitals:  BP 110/80   Ht 5\' 2"  (1.575 m)   Wt 136 lb (61.7 kg)   BMI 24.87 kg/m   Physical Exam Vitals reviewed.  Constitutional:      Appearance: She is well-developed.  Pulmonary:     Effort: Pulmonary effort is normal.  Musculoskeletal:        General: Normal range of motion.     Cervical back: Normal range of motion.  Skin:    General: Skin is warm and dry.  Neurological:     General: No focal deficit present.     Mental Status: She is alert and oriented to person, place, and time.     Cranial Nerves: No cranial nerve deficit.  Psychiatric:         Mood and Affect: Mood normal.        Behavior: Behavior normal.        Thought Content: Thought content normal.        Judgment: Judgment normal.     Results:  GAD 7 : Generalized Anxiety Score 11/28/2019 11/06/2019 10/14/2019 06/30/2019  Nervous, Anxious, on Edge 2 0 0 0  Control/stop worrying 2 0 0 0  Worry too much - different things 0 0 0 0  Trouble relaxing 1 0 0 0  Restless 0 0 0 0  Easily annoyed or irritable 1 0 0 0  Afraid -  awful might happen 1 0 0 0  Total GAD 7 Score 7 0 0 0  Anxiety Difficulty Not difficult at all - - -    Depression screen PHQ 2/9 11/28/2019  Decreased Interest -  Down, Depressed, Hopeless 0  PHQ - 2 Score 0  Altered sleeping 2  Tired, decreased energy 1  Change in appetite 0  Feeling bad or failure about yourself  0  Trouble concentrating 0  Moving slowly or fidgety/restless 0  Suicidal thoughts 0  PHQ-9 Score 3  Difficult doing work/chores Not difficult at all     Assessment/Plan: Dizziness - Plan: Prolactin, Ambulatory referral to Neurology; Pt's sx for a yr, not improving. Had neg ENT eval. Refer to neuro for further eval. Check prolactin.  Discussed that sx can occur about the same time as menopause, and maybe hormonal changes triggered dizziness sx, but HRT not tx for vertigo and not common sx of menopause.   Blood tests for routine general physical examination - Plan: Prolactin  Postmenopause--no PMB. LMP 11/19. Vasomotor sx not bad enough to treat.    Return if symptoms worsen or fail to improve.  Inessa Wardrop B. Donita Newland, PA-C 11/28/2019 10:07 AM

## 2019-11-28 ENCOUNTER — Encounter: Payer: Self-pay | Admitting: Obstetrics and Gynecology

## 2019-11-28 ENCOUNTER — Ambulatory Visit (INDEPENDENT_AMBULATORY_CARE_PROVIDER_SITE_OTHER): Payer: BC Managed Care – PPO | Admitting: Obstetrics and Gynecology

## 2019-11-28 ENCOUNTER — Other Ambulatory Visit: Payer: Self-pay

## 2019-11-28 VITALS — BP 110/80 | Ht 62.0 in | Wt 136.0 lb

## 2019-11-28 DIAGNOSIS — Z0000 Encounter for general adult medical examination without abnormal findings: Secondary | ICD-10-CM

## 2019-11-28 DIAGNOSIS — R42 Dizziness and giddiness: Secondary | ICD-10-CM

## 2019-11-28 DIAGNOSIS — Z780 Asymptomatic menopausal state: Secondary | ICD-10-CM | POA: Diagnosis not present

## 2019-11-28 DIAGNOSIS — F418 Other specified anxiety disorders: Secondary | ICD-10-CM | POA: Diagnosis not present

## 2019-11-28 NOTE — Patient Instructions (Signed)
I value your feedback and entrusting us with your care. If you get a [facility] patient survey, I would appreciate you taking the time to let us know about your experience today. Thank you!  As of September 04, 2019, your lab results will be released to your MyChart immediately, before I even have a chance to see them. Please give me time to review them and contact you if there are any abnormalities. Thank you for your patience.  

## 2019-11-29 LAB — PROLACTIN: Prolactin: 8.4 ng/mL (ref 4.8–23.3)

## 2019-12-01 ENCOUNTER — Ambulatory Visit: Payer: Self-pay | Admitting: Obstetrics and Gynecology

## 2019-12-16 ENCOUNTER — Telehealth: Payer: Self-pay | Admitting: Obstetrics and Gynecology

## 2019-12-16 NOTE — Telephone Encounter (Signed)
Pt called returning your phone call  

## 2019-12-16 NOTE — Telephone Encounter (Signed)
Spoke to pt

## 2019-12-17 ENCOUNTER — Ambulatory Visit: Payer: BC Managed Care – PPO | Admitting: Family Medicine

## 2019-12-18 ENCOUNTER — Other Ambulatory Visit: Payer: Self-pay | Admitting: Acute Care

## 2019-12-18 ENCOUNTER — Other Ambulatory Visit (HOSPITAL_COMMUNITY): Payer: Self-pay | Admitting: Acute Care

## 2019-12-18 DIAGNOSIS — R42 Dizziness and giddiness: Secondary | ICD-10-CM

## 2019-12-19 ENCOUNTER — Ambulatory Visit (INDEPENDENT_AMBULATORY_CARE_PROVIDER_SITE_OTHER): Payer: BC Managed Care – PPO | Admitting: Family Medicine

## 2019-12-19 ENCOUNTER — Encounter: Payer: Self-pay | Admitting: Family Medicine

## 2019-12-19 ENCOUNTER — Other Ambulatory Visit: Payer: Self-pay

## 2019-12-19 VITALS — BP 122/80 | HR 83 | Temp 97.0°F | Ht 62.0 in | Wt 137.0 lb

## 2019-12-19 DIAGNOSIS — E785 Hyperlipidemia, unspecified: Secondary | ICD-10-CM | POA: Diagnosis not present

## 2019-12-19 DIAGNOSIS — R69 Illness, unspecified: Secondary | ICD-10-CM | POA: Diagnosis not present

## 2019-12-19 DIAGNOSIS — I10 Essential (primary) hypertension: Secondary | ICD-10-CM

## 2019-12-19 MED ORDER — HYDROCHLOROTHIAZIDE 12.5 MG PO CAPS: 12.5000 mg | ORAL_CAPSULE | Freq: Every day | ORAL | 1 refills | Status: DC

## 2019-12-19 MED ORDER — ATORVASTATIN CALCIUM 10 MG PO TABS: 10.0000 mg | ORAL_TABLET | Freq: Every day | ORAL | 1 refills | Status: DC

## 2019-12-19 NOTE — Progress Notes (Signed)
Date:  12/19/2019   Name:  Diane Marks   DOB:  12-May-1966   MRN:  [id number]   Chief Complaint: Hypertension  Hypertension This is a chronic problem. The current episode started more than 1 year ago. The problem has been gradually improving since onset. The problem is controlled. Pertinent negatives include no anxiety, blurred vision, chest pain, headaches, malaise/fatigue, neck pain, orthopnea, palpitations, peripheral edema, PND, shortness of breath or sweats. There are no associated agents to hypertension. There are no known risk factors for coronary artery disease. Past treatments include diuretics. The current treatment provides moderate improvement. There are no compliance problems.  There is no history of angina, kidney disease, CAD/MI, CVA, heart failure, left ventricular hypertrophy, PVD or retinopathy. There is no history of chronic renal disease, a hypertension causing med or renovascular disease.  Hyperlipidemia This is a chronic problem. The current episode started more than 1 year ago. The problem is controlled. Recent lipid tests were reviewed and are normal. She has no history of chronic renal disease, diabetes, hypothyroidism, liver disease, obesity or nephrotic syndrome. There are no known factors aggravating her hyperlipidemia. Pertinent negatives include no chest pain, focal sensory loss, focal weakness, leg pain, myalgias or shortness of breath. Current antihyperlipidemic treatment includes statins. The current treatment provides mild improvement of lipids.    Lab Results  Component Value Date   CREATININE 0.91 11/06/2019   BUN 14 11/06/2019   NA 141 11/06/2019   K 4.7 11/06/2019   CL 105 11/06/2019   CO2 23 11/06/2019   Lab Results  Component Value Date   CHOL 254 (H) 11/06/2019   HDL 53 11/06/2019   LDLCALC 183 (H) 11/06/2019   TRIG 101 11/06/2019   CHOLHDL 4.4 06/10/2018   Lab Results  Component Value Date   TSH 1.450 11/06/2019   No results found  for: HGBA1C Lab Results  Component Value Date   WBC 4.8 06/30/2019   HGB 15.5 06/30/2019   HCT 46.6 06/30/2019   MCV 90 06/30/2019   PLT 323 06/30/2019   Lab Results  Component Value Date   ALT 21 11/06/2019   AST 25 11/06/2019   ALKPHOS 74 11/06/2019   BILITOT 0.4 11/06/2019     Review of Systems  Constitutional: Negative.  Negative for chills, fatigue, fever, malaise/fatigue and unexpected weight change.  HENT: Negative for congestion, ear discharge, ear pain, rhinorrhea, sinus pressure, sneezing and sore throat.   Eyes: Negative for blurred vision, photophobia, pain, discharge, redness and itching.  Respiratory: Negative for cough, shortness of breath, wheezing and stridor.   Cardiovascular: Negative for chest pain, palpitations, orthopnea and PND.  Gastrointestinal: Negative for abdominal pain, blood in stool, constipation, diarrhea, nausea and vomiting.  Endocrine: Negative for cold intolerance, heat intolerance, polydipsia, polyphagia and polyuria.  Genitourinary: Negative for dysuria, flank pain, frequency, hematuria, menstrual problem, pelvic pain, urgency, vaginal bleeding and vaginal discharge.  Musculoskeletal: Negative for arthralgias, back pain, myalgias and neck pain.  Skin: Negative for rash.  Allergic/Immunologic: Negative for environmental allergies and food allergies.  Neurological: Negative for dizziness, focal weakness, weakness, light-headedness, numbness and headaches.  Hematological: Negative for adenopathy. Does not bruise/bleed easily.  Psychiatric/Behavioral: Negative for dysphoric mood. The patient is not nervous/anxious.     Patient Active Problem List   Diagnosis Date Noted  . Postmenopause 11/28/2019  . Dizziness 11/28/2019  . Constipation 06/12/2019  . Diverticulitis 06/12/2019  . Gram-negative bacteremia 06/12/2019  . Transaminitis 06/12/2019  . Perimenopause 10/09/2017  . Special  screening for malignant neoplasms, colon   . Fatigue  03/09/2015  . Visual disturbance 03/09/2015  . History of tobacco use 03/02/2015  . HLD (hyperlipidemia) 03/02/2015  . 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 Routine general medical examination at a health care facility 03/01/2015  . At risk of disease 03/01/2015  . Cephalalgia 03/01/2015  . Irritable bowel syndrome 03/01/2015    No Known Allergies  Past Surgical History:  Procedure Laterality Date  . CESAREAN SECTION    . COLONOSCOPY WITH PROPOFOL N/A 08/05/2015   Procedure: COLONOSCOPY WITH PROPOFOL;  Surgeon: Midge Minium, MD;  Location: [facility];  Service: Endoscopy;  Laterality: N/A;    Social History   Tobacco Use  . Smoking status: Former Games developer  . Smokeless tobacco: Never Used  . Tobacco comment: quit 20 yrs ago  Substance Use Topics  . Alcohol use: Yes    Alcohol/week: 0.0 standard drinks    Comment: rare  . Drug use: No     Medication list has been reviewed and updated.  Current Meds  Medication Sig  . atorvastatin (LIPITOR) 10 MG tablet Take 1 tablet (10 mg total) by mouth daily.  . Cholecalciferol (VITAMIN D-3 PO) Take by mouth.  . hydrochlorothiazide (MICROZIDE) 12.5 MG capsule Take 1 capsule (12.5 mg total) by mouth daily.  . hydrOXYzine (ATARAX/VISTARIL) 10 MG tablet Take 1 tablet (10 mg total) by mouth 3 (three) times daily as needed.  . Multiple Vitamin (MULTIVITAMIN) tablet Take 1 tablet by mouth daily.    PHQ 2/9 Scores 12/19/2019 11/28/2019 11/06/2019 10/14/2019  PHQ - 2 Score 0 0 0 0  PHQ- 9 Score 0 3 0 0    BP Readings from Last 3 Encounters:  12/19/19 122/80  11/28/19 110/80  11/06/19 (!) 138/98    Physical Exam Vitals and nursing note reviewed.  Constitutional:      Appearance: She is well-developed.  HENT:     Head: Normocephalic.     Right Ear: Tympanic membrane, ear canal and external ear normal.     Left Ear: Tympanic membrane, ear canal and external ear normal.     Nose: Nose normal.     Mouth/Throat:     Mouth: Mucous  membranes are moist.  Eyes:     General: Lids are everted, no foreign bodies appreciated. No scleral icterus.       Left eye: No foreign body or hordeolum.     Conjunctiva/sclera: Conjunctivae normal.     Right eye: Right conjunctiva is not injected.     Left eye: Left conjunctiva is not injected.     Pupils: Pupils are equal, round, and reactive to light.  Neck:     Thyroid: No thyromegaly.     Vascular: No JVD.     Trachea: No tracheal deviation.  Cardiovascular:     Rate and Rhythm: Normal rate and regular rhythm.     Heart sounds: Normal heart sounds. No murmur. No friction rub. No gallop.   Pulmonary:     Effort: Pulmonary effort is normal. No respiratory distress.     Breath sounds: Normal breath sounds. No wheezing, rhonchi or rales.  Abdominal:     General: Bowel sounds are normal.     Palpations: Abdomen is soft. There is no mass.     Tenderness: There is no abdominal tenderness. There is no guarding or rebound.  Musculoskeletal:        General: No tenderness. Normal range of motion.     Cervical back: Normal range  of motion and neck supple.  Lymphadenopathy:     Cervical: No cervical adenopathy.  Skin:    General: Skin is warm.     Findings: No rash.  Neurological:     Mental Status: She is alert and oriented to person, place, and time.     Cranial Nerves: No cranial nerve deficit.     Deep Tendon Reflexes: Reflexes normal.  Psychiatric:        Mood and Affect: Mood is not anxious or depressed.     Wt Readings from Last 3 Encounters:  12/19/19 137 lb (62.1 kg)  11/28/19 136 lb (61.7 kg)  11/06/19 137 lb (62.1 kg)    BP 122/80   Pulse 83   Temp (!) 97 F (36.1 C) (Oral)   Ht 5\' 2"  (1.575 m)   Wt 137 lb (62.1 kg)   SpO2 97%   BMI 25.06 kg/m   Assessment and Plan: 1. Essential hypertension New onset.  Controlled.  Stable.  Since initiation of hydrochlorothiazide 12.5 mg blood pressure has returned to a normal reading level of 122/80. -  hydrochlorothiazide (MICROZIDE) 12.5 MG capsule; Take 1 capsule (12.5 mg total) by mouth daily.  Dispense: 90 capsule; Refill: 1  2. Hyperlipidemia, unspecified hyperlipidemia type Chronic.  Controlled.  Stable.  Continue atorvastatin 10 mg once a day.  Will check lipid panel. - atorvastatin (LIPITOR) 10 MG tablet; Take 1 tablet (10 mg total) by mouth daily.  Dispense: 90 tablet; Refill: 1 - Lipid Panel With LDL/HDL Ratio  3. Taking medication for chronic disease Patient is currently on a diuretic and we will check her serum potassium to make sure there is no diminishing of the level. - Potassium

## 2019-12-20 LAB — POTASSIUM: Potassium: 4.5 mmol/L (ref 3.5–5.2)

## 2019-12-20 LAB — LIPID PANEL WITH LDL/HDL RATIO
Cholesterol, Total: 181 mg/dL (ref 100–199)
HDL: 50 mg/dL (ref >39)
LDL Chol Calc (NIH): 113 mg/dL — ABNORMAL HIGH (ref 0–99)
LDL/HDL Ratio: 2.3 ratio (ref 0.0–3.2)
Triglycerides: 97 mg/dL (ref 0–149)
VLDL Cholesterol Cal: 18 mg/dL (ref 5–40)

## 2019-12-30 ENCOUNTER — Ambulatory Visit
Admission: RE | Admit: 2019-12-30 | Discharge: 2019-12-30 | Disposition: A | Discharge status: Home / Self Care | Payer: BC Managed Care – PPO | Source: Ambulatory Visit | Attending: Acute Care | Admitting: Acute Care

## 2019-12-30 ENCOUNTER — Other Ambulatory Visit: Payer: Self-pay

## 2019-12-30 DIAGNOSIS — R42 Dizziness and giddiness: Secondary | ICD-10-CM | POA: Diagnosis present

## 2020-01-07 ENCOUNTER — Encounter: Payer: Self-pay | Admitting: Family Medicine

## 2020-01-08 ENCOUNTER — Other Ambulatory Visit: Payer: Self-pay

## 2020-03-30 ENCOUNTER — Ambulatory Visit
Admission: EM | Admit: 2020-03-30 | Discharge: 2020-03-30 | Disposition: A | Discharge status: Home / Self Care | Payer: BC Managed Care – PPO | Attending: Internal Medicine | Admitting: Internal Medicine

## 2020-03-30 ENCOUNTER — Ambulatory Visit: Payer: BC Managed Care – PPO | Admitting: Family Medicine

## 2020-03-30 DIAGNOSIS — B029 Zoster without complications: Secondary | ICD-10-CM | POA: Diagnosis not present

## 2020-03-30 MED ORDER — VALACYCLOVIR HCL 1 G PO TABS
1000.0000 mg | ORAL_TABLET | Freq: Three times a day (TID) | ORAL | 0 refills | Status: AC
Start: 2020-03-30 — End: 2020-04-06

## 2020-03-30 NOTE — ED Triage Notes (Signed)
Pt presents with rash x 4 days.  Rash presents on posterior neck and in line toward right shoulder.  Initially had blisters and spread but no worsening or improvement in last 24 hours.  Pt not exposed to any known irritants.  States it isn't necessarily painful but can tell it's there.

## 2020-03-30 NOTE — ED Provider Notes (Signed)
[facility]    CSN: [id number] Arrival date & time: 03/30/20  1134      History   Chief Complaint Chief Complaint  Patient presents with  . Rash    HPI Diane Marks is a 54 y.o. female comes to urgent care with rash over the neck area.  Symptoms started 3 days ago and has been progressing.  Started as a discomfort in the nape of the neck.  Patient experienced some itching sensation as well as dysesthesia.  Subsequently patient noticed vesicular rash which spread over the right side of the neck.  No burning sensation.  No fever or chills.  Patient had chickenpox as a child.  No upper respiratory infection symptoms. HPI  Past Medical History:  Diagnosis Date  . Diverticulitis   . Hyperlipidemia   . Irritable bowel syndrome   . Vertigo    no episodes 12-18 months  . Wears contact lenses     Patient Active Problem List   Diagnosis Date Noted  . Postmenopause 11/28/2019  . Dizziness 11/28/2019  . Constipation 06/12/2019  . Diverticulitis 06/12/2019  . Gram-negative bacteremia 06/12/2019  . Transaminitis 06/12/2019  . Perimenopause 10/09/2017  . Special screening for malignant neoplasms, colon   . Fatigue 03/09/2015  . Visual disturbance 03/09/2015  . History of tobacco use 03/02/2015  . HLD (hyperlipidemia) 03/02/2015  . 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 Routine general medical examination at a health care facility 03/01/2015  . At risk of disease 03/01/2015  . Cephalalgia 03/01/2015  . Irritable bowel syndrome 03/01/2015    Past Surgical History:  Procedure Laterality Date  . CESAREAN SECTION    . COLONOSCOPY WITH PROPOFOL N/A 08/05/2015   Procedure: COLONOSCOPY WITH PROPOFOL;  Surgeon: Midge Minium, MD;  Location: [facility];  Service: Endoscopy;  Laterality: N/A;    OB History    Gravida  4   Para  2   Term      Preterm      AB  2   Living  2     SAB      TAB  1   Ectopic  1   Multiple      Live Births                Home Medications    Prior to Admission medications   Medication Sig Start Date End Date Taking? Authorizing Provider  atorvastatin (LIPITOR) 10 MG tablet Take 1 tablet (10 mg total) by mouth daily. 12/19/19  Yes Duanne Limerick, MD  Cholecalciferol (VITAMIN D-3 PO) Take by mouth.   Yes [provider]  hydrochlorothiazide (MICROZIDE) 12.5 MG capsule Take 1 capsule (12.5 mg total) by mouth daily. 12/19/19  Yes Duanne Limerick, MD  meclizine (ANTIVERT) 12.5 MG tablet Take 12.5 mg by mouth 3 (three) times daily. 12/16/19  Yes [provider]  Multiple Vitamin (MULTIVITAMIN) tablet Take 1 tablet by mouth daily.   Yes [provider]  valACYclovir (VALTREX) 1000 MG tablet Take 1 tablet (1,000 mg total) by mouth 3 (three) times daily for 7 days. 03/30/20 04/06/20  Merrilee Jansky, MD    Family History Family History  Problem Relation Age of Onset  . Hyperlipidemia Mother   . Diabetes Father   . Hyperlipidemia Father   . Hypertension Father   . Pancreatic cancer Father 62  . Heart disease Maternal Grandmother   . Cancer Paternal Grandmother 65       mouth  . Breast cancer  Neg Hx     Social History Social History   Tobacco Use  . Smoking status: Former Games developer  . Smokeless tobacco: Never Used  . Tobacco comment: quit 20 yrs ago  Vaping Use  . Vaping Use: Never used  Substance Use Topics  . Alcohol use: Yes    Alcohol/week: 0.0 standard drinks    Comment: rare  . Drug use: No     Allergies   Patient has no known allergies.   Review of Systems Review of Systems  Constitutional: Negative.   Respiratory: Negative.   Gastrointestinal: Negative.   Musculoskeletal: Negative.   Skin: Positive for color change and rash. Negative for wound.  Neurological: Negative.      Physical Exam Triage Vital Signs ED Triage Vitals  Enc Vitals Group     BP 03/30/20 1234 (!) 126/96     Pulse Rate 03/30/20 1234 71     Resp 03/30/20 1234 18     Temp  03/30/20 1234 98.1 F (36.7 C)     Temp Source 03/30/20 1234 Oral     SpO2 03/30/20 1234 98 %     Weight --      Height --      Head Circumference --      Peak Flow --      Pain Score 03/30/20 1232 0     Pain Loc --      Pain Edu? --      Excl. in GC? --    No data found.  Updated Vital Signs BP (!) 126/96 (BP Location: Right Arm)   Pulse 71   Temp 98.1 F (36.7 C) (Oral)   Resp 18   SpO2 98%   Visual Acuity Right Eye Distance:   Left Eye Distance:   Bilateral Distance:    Right Eye Near:   Left Eye Near:    Bilateral Near:     Physical Exam Cardiovascular:     Pulses: Normal pulses.     Heart sounds: Normal heart sounds.  Pulmonary:     Effort: Pulmonary effort is normal.     Breath sounds: Normal breath sounds.  Abdominal:     General: Bowel sounds are normal.     Palpations: Abdomen is soft.  Skin:    General: Skin is warm.     Capillary Refill: Capillary refill takes less than 2 seconds.     Findings: Erythema and rash present. No bruising or lesion.          UC Treatments / Results  Labs (all labs ordered are listed, but only abnormal results are displayed) Labs Reviewed - No data to display  EKG   Radiology No results found.  Procedures Procedures (including critical care time)  Medications Ordered in UC Medications - No data to display  Initial Impression / Assessment and Plan / UC Course  I have reviewed the triage vital signs and the nursing notes.  Pertinent labs & imaging results that were available during my care of the patient were reviewed by me and considered in my medical decision making (see chart for details).     1.  Herpes zoster of the neck: Valtrex 1000 mg 3 times daily for 7 days Tylenol as needed for pain If patient experiences worsening pain, erythema and extension of the rash in spite of the medications that she is advised to return to the urgent care to be reevaluated This is the first of such episode that  the patient has had. Final  Clinical Impressions(s) / UC Diagnoses   Final diagnoses:  Herpes zoster without complication   Discharge Instructions   None    ED Prescriptions    Medication Sig Dispense Auth. Provider   valACYclovir (VALTREX) 1000 MG tablet Take 1 tablet (1,000 mg total) by mouth 3 (three) times daily for 7 days. 21 tablet Mettie Roylance, Britta Mccreedy, MD     PDMP not reviewed this encounter.   Merrilee Jansky, MD 03/30/20 [phone]

## 2020-04-12 ENCOUNTER — Other Ambulatory Visit: Payer: Self-pay | Admitting: Family Medicine

## 2020-04-12 DIAGNOSIS — Z1231 Encounter for screening mammogram for malignant neoplasm of breast: Secondary | ICD-10-CM

## 2020-04-19 ENCOUNTER — Ambulatory Visit
Admission: RE | Admit: 2020-04-19 | Discharge: 2020-04-19 | Disposition: A | Discharge status: Home / Self Care | Payer: BC Managed Care – PPO | Source: Ambulatory Visit | Attending: Family Medicine | Admitting: Family Medicine

## 2020-04-19 ENCOUNTER — Other Ambulatory Visit: Payer: Self-pay

## 2020-04-19 DIAGNOSIS — Z1231 Encounter for screening mammogram for malignant neoplasm of breast: Secondary | ICD-10-CM | POA: Insufficient documentation

## 2020-05-12 ENCOUNTER — Other Ambulatory Visit: Payer: Self-pay

## 2020-05-12 ENCOUNTER — Ambulatory Visit (INDEPENDENT_AMBULATORY_CARE_PROVIDER_SITE_OTHER): Payer: BC Managed Care – PPO | Admitting: Family Medicine

## 2020-05-12 ENCOUNTER — Encounter: Payer: Self-pay | Admitting: Family Medicine

## 2020-05-12 VITALS — BP 132/80 | HR 78 | Ht 62.0 in | Wt 136.4 lb

## 2020-05-12 DIAGNOSIS — I10 Essential (primary) hypertension: Secondary | ICD-10-CM

## 2020-05-12 DIAGNOSIS — E785 Hyperlipidemia, unspecified: Secondary | ICD-10-CM

## 2020-05-12 DIAGNOSIS — R69 Illness, unspecified: Secondary | ICD-10-CM | POA: Diagnosis not present

## 2020-05-12 NOTE — Patient Instructions (Signed)
DASH Eating Plan DASH stands for "Dietary Approaches to Stop Hypertension." The DASH eating plan is a healthy eating plan that has been shown to reduce high blood pressure (hypertension). It may also reduce your risk for type 2 diabetes, heart disease, and stroke. The DASH eating plan may also help with weight loss. What are tips for following this plan?  General guidelines  Avoid eating more than 2,300 mg (milligrams) of salt (sodium) a day. If you have hypertension, you may need to reduce your sodium intake to 1,500 mg a day.  Limit alcohol intake to no more than 1 drink a day for nonpregnant women and 2 drinks a day for men. One drink equals 12 oz of beer, 5 oz of wine, or 1 oz of hard liquor.  Work with your health care provider to maintain a healthy body weight or to lose weight. Ask what an ideal weight is for you.  Get at least 30 minutes of exercise that causes your heart to beat faster (aerobic exercise) most days of the week. Activities may include walking, swimming, or biking.  Work with your health care provider or diet and nutrition specialist (dietitian) to adjust your eating plan to your individual calorie needs. Reading food labels   Check food labels for the amount of sodium per serving. Choose foods with less than 5 percent of the Daily Value of sodium. Generally, foods with less than 300 mg of sodium per serving fit into this eating plan.  To find whole grains, look for the word "whole" as the first word in the ingredient list. Shopping  Buy products labeled as "low-sodium" or "no salt added."  Buy fresh foods. Avoid canned foods and premade or frozen meals. Cooking  Avoid adding salt when cooking. Use salt-free seasonings or herbs instead of table salt or sea salt. Check with your health care provider or pharmacist before using salt substitutes.  Do not fry foods. Cook foods using healthy methods such as baking, boiling, grilling, and broiling instead.  Cook with  heart-healthy oils, such as olive, canola, soybean, or sunflower oil. Meal planning  Eat a balanced diet that includes: ? 5 or more servings of fruits and vegetables each day. At each meal, try to fill half of your plate with fruits and vegetables. ? Up to 6-8 servings of whole grains each day. ? Less than 6 oz of lean meat, poultry, or fish each day. A 3-oz serving of meat is about the same size as a deck of cards. One egg equals 1 oz. ? 2 servings of low-fat dairy each day. ? A serving of nuts, seeds, or beans 5 times each week. ? Heart-healthy fats. Healthy fats called Omega-3 fatty acids are found in foods such as flaxseeds and coldwater fish, like sardines, salmon, and mackerel.  Limit how much you eat of the following: ? Canned or prepackaged foods. ? Food that is high in trans fat, such as fried foods. ? Food that is high in saturated fat, such as fatty meat. ? Sweets, desserts, sugary drinks, and other foods with added sugar. ? Full-fat dairy products.  Do not salt foods before eating.  Try to eat at least 2 vegetarian meals each week.  Eat more home-cooked food and less restaurant, buffet, and fast food.  When eating at a restaurant, ask that your food be prepared with less salt or no salt, if possible. What foods are recommended? The items listed may not be a complete list. Talk with your dietitian about   what dietary choices are best for you. Grains Whole-grain or whole-wheat bread. Whole-grain or whole-wheat pasta. Brown rice. Oatmeal. Quinoa. Bulgur. Whole-grain and low-sodium cereals. Pita bread. Low-fat, low-sodium crackers. Whole-wheat flour tortillas. Vegetables Fresh or frozen vegetables (raw, steamed, roasted, or grilled). Low-sodium or reduced-sodium tomato and vegetable juice. Low-sodium or reduced-sodium tomato sauce and tomato paste. Low-sodium or reduced-sodium canned vegetables. Fruits All fresh, dried, or frozen fruit. Canned fruit in natural juice (without  added sugar). Meat and other protein foods Skinless chicken or turkey. Ground chicken or turkey. Pork with fat trimmed off. Fish and seafood. Egg whites. Dried beans, peas, or lentils. Unsalted nuts, nut butters, and seeds. Unsalted canned beans. Lean cuts of beef with fat trimmed off. Low-sodium, lean deli meat. Dairy Low-fat (1%) or fat-free (skim) milk. Fat-free, low-fat, or reduced-fat cheeses. Nonfat, low-sodium ricotta or cottage cheese. Low-fat or nonfat yogurt. Low-fat, low-sodium cheese. Fats and oils Soft margarine without trans fats. Vegetable oil. Low-fat, reduced-fat, or light mayonnaise and salad dressings (reduced-sodium). Canola, safflower, olive, soybean, and sunflower oils. Avocado. Seasoning and other foods Herbs. Spices. Seasoning mixes without salt. Unsalted popcorn and pretzels. Fat-free sweets. What foods are not recommended? The items listed may not be a complete list. Talk with your dietitian about what dietary choices are best for you. Grains Baked goods made with fat, such as croissants, muffins, or some breads. Dry pasta or rice meal packs. Vegetables Creamed or fried vegetables. Vegetables in a cheese sauce. Regular canned vegetables (not low-sodium or reduced-sodium). Regular canned tomato sauce and paste (not low-sodium or reduced-sodium). Regular tomato and vegetable juice (not low-sodium or reduced-sodium). Pickles. Olives. Fruits Canned fruit in a light or heavy syrup. Fried fruit. Fruit in cream or butter sauce. Meat and other protein foods Fatty cuts of meat. Ribs. Fried meat. Bacon. Sausage. Bologna and other processed lunch meats. Salami. Fatback. Hotdogs. Bratwurst. Salted nuts and seeds. Canned beans with added salt. Canned or smoked fish. Whole eggs or egg yolks. Chicken or turkey with skin. Dairy Whole or 2% milk, cream, and half-and-half. Whole or full-fat cream cheese. Whole-fat or sweetened yogurt. Full-fat cheese. Nondairy creamers. Whipped toppings.  Processed cheese and cheese spreads. Fats and oils Butter. Stick margarine. Lard. Shortening. Ghee. Bacon fat. Tropical oils, such as coconut, palm kernel, or palm oil. Seasoning and other foods Salted popcorn and pretzels. Onion salt, garlic salt, seasoned salt, table salt, and sea salt. Worcestershire sauce. Tartar sauce. Barbecue sauce. Teriyaki sauce. Soy sauce, including reduced-sodium. Steak sauce. Canned and packaged gravies. Fish sauce. Oyster sauce. Cocktail sauce. Horseradish that you find on the shelf. Ketchup. Mustard. Meat flavorings and tenderizers. Bouillon cubes. Hot sauce and Tabasco sauce. Premade or packaged marinades. Premade or packaged taco seasonings. Relishes. Regular salad dressings. Where to find more information:  National Heart, Lung, and Blood Institute: www.nhlbi.nih.gov  American Heart Association: www.heart.org Summary  The DASH eating plan is a healthy eating plan that has been shown to reduce high blood pressure (hypertension). It may also reduce your risk for type 2 diabetes, heart disease, and stroke.  With the DASH eating plan, you should limit salt (sodium) intake to 2,300 mg a day. If you have hypertension, you may need to reduce your sodium intake to 1,500 mg a day.  When on the DASH eating plan, aim to eat more fresh fruits and vegetables, whole grains, lean proteins, low-fat dairy, and heart-healthy fats.  Work with your health care provider or diet and nutrition specialist (dietitian) to adjust your eating plan to your   individual calorie needs. This information is not intended to replace advice given to you by your health care provider. Make sure you discuss any questions you have with your health care provider. Document Revised: 08/24/2017 Document Reviewed: 09/04/2016 Elsevier Patient Education  2020 Elsevier Inc.  

## 2020-05-12 NOTE — Progress Notes (Signed)
Date:  05/12/2020   Name:  Diane Marks   DOB:  21-Feb-1966   MRN:  [id number]   Chief Complaint: Hyperlipidemia (RF on atorvastatin and hctz. ) and Hypertension (Wants to discuss hctz. Said she feels dehydrated and drinks a lot of water. )  Hyperlipidemia This is a chronic problem. The current episode started more than 1 year ago. The problem is controlled. Recent lipid tests were reviewed and are normal. She has no history of chronic renal disease, diabetes, hypothyroidism, liver disease, obesity or nephrotic syndrome. There are no known factors aggravating her hyperlipidemia. Pertinent negatives include no chest pain, focal sensory loss, focal weakness, leg pain, myalgias or shortness of breath. Current antihyperlipidemic treatment includes statins. The current treatment provides moderate improvement of lipids. There are no compliance problems.  Risk factors for coronary artery disease include diabetes mellitus and dyslipidemia.  Hypertension The current episode started more than 1 year ago. The problem is unchanged. The problem is controlled. Pertinent negatives include no anxiety, blurred vision, chest pain, headaches, malaise/fatigue, neck pain, orthopnea, palpitations, peripheral edema, PND, shortness of breath or sweats. There is no history of chronic renal disease.    Lab Results  Component Value Date   CREATININE 0.91 11/06/2019   BUN 14 11/06/2019   NA 141 11/06/2019   K 4.5 12/19/2019   CL 105 11/06/2019   CO2 23 11/06/2019   Lab Results  Component Value Date   CHOL 181 12/19/2019   HDL 50 12/19/2019   LDLCALC 113 (H) 12/19/2019   TRIG 97 12/19/2019   CHOLHDL 4.4 06/10/2018   Lab Results  Component Value Date   TSH 1.450 11/06/2019   No results found for: HGBA1C Lab Results  Component Value Date   WBC 4.8 06/30/2019   HGB 15.5 06/30/2019   HCT 46.6 06/30/2019   MCV 90 06/30/2019   PLT 323 06/30/2019   Lab Results  Component Value Date   ALT 21  11/06/2019   AST 25 11/06/2019   ALKPHOS 74 11/06/2019   BILITOT 0.4 11/06/2019     Review of Systems  Constitutional: Negative for chills, fever and malaise/fatigue.       Malaise  HENT: Negative for drooling, ear discharge, ear pain and sore throat.        Dry mouth  Eyes: Negative for blurred vision.  Respiratory: Negative for cough, shortness of breath and wheezing.   Cardiovascular: Negative for chest pain, palpitations, orthopnea, leg swelling and PND.  Gastrointestinal: Negative for abdominal pain, blood in stool, constipation, diarrhea and nausea.  Endocrine: Negative for polydipsia.  Genitourinary: Negative for dysuria, frequency, hematuria and urgency.  Musculoskeletal: Negative for back pain, myalgias and neck pain.  Skin: Negative for rash.  Allergic/Immunologic: Negative for environmental allergies.  Neurological: Positive for light-headedness. Negative for dizziness, focal weakness and headaches.  Hematological: Does not bruise/bleed easily.  Psychiatric/Behavioral: Negative for suicidal ideas. The patient is not nervous/anxious.     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   Routine general medical examination at a health care facility 03/01/2015   At risk of disease 03/01/2015  Cephalalgia 03/01/2015   Irritable bowel syndrome 03/01/2015    No Known Allergies  Past Surgical History:  Procedure Laterality Date   CESAREAN SECTION     COLONOSCOPY WITH PROPOFOL N/A 08/05/2015   Procedure: COLONOSCOPY WITH PROPOFOL;  Surgeon:  Midge Minium, MD;  Location: [facility];  Service: Endoscopy;  Laterality: N/A;    Social History   Tobacco Use   Smoking status: Former Smoker   Smokeless tobacco: Never Used   Tobacco comment: quit 20 yrs ago  Vaping Use   Vaping Use: Never used  Substance Use Topics   Alcohol use: Yes    Alcohol/week: 0.0 standard drinks    Comment: rare   Drug use: No     Medication list has been reviewed and updated.  Current Meds  Medication Sig   atorvastatin (LIPITOR) 10 MG tablet Take 1 tablet (10 mg total) by mouth daily.   Cholecalciferol (VITAMIN D-3 PO) Take by mouth.   hydrochlorothiazide (MICROZIDE) 12.5 MG capsule Take 1 capsule (12.5 mg total) by mouth daily.   hydrOXYzine (ATARAX/VISTARIL) 10 MG tablet Take 10 mg by mouth 3 (three) times daily as needed.   Multiple Vitamin (MULTIVITAMIN) tablet Take 1 tablet by mouth daily.    PHQ 2/9 Scores 12/19/2019 11/28/2019 11/06/2019 10/14/2019  PHQ - 2 Score 0 0 0 0  PHQ- 9 Score 0 3 0 0    GAD 7 : Generalized Anxiety Score 11/28/2019 11/06/2019 10/14/2019 06/30/2019  Nervous, Anxious, on Edge 2 0 0 0  Control/stop worrying 2 0 0 0  Worry too much - different things 0 0 0 0  Trouble relaxing 1 0 0 0  Restless 0 0 0 0  Easily annoyed or irritable 1 0 0 0  Afraid - awful might happen 1 0 0 0  Total GAD 7 Score 7 0 0 0  Anxiety Difficulty Not difficult at all - - -    BP Readings from Last 3 Encounters:  05/12/20 132/80  03/30/20 (!) 126/96  12/19/19 122/80    Physical Exam Vitals and nursing note reviewed.  Constitutional:      Appearance: She is well-developed.  HENT:     Head: Normocephalic.     Right Ear: Tympanic membrane, ear canal and external ear normal.     Left Ear: Tympanic membrane, ear canal and external ear normal.     Nose: Nose normal.     Mouth/Throat:     Mouth: Mucous membranes are moist.  Eyes:     General: Lids are everted, no foreign bodies appreciated. No scleral icterus.        Left eye: No foreign body or hordeolum.     Conjunctiva/sclera: Conjunctivae normal.     Right eye: Right conjunctiva is not injected.     Left eye: Left conjunctiva is not injected.     Pupils: Pupils are equal, round, and reactive to light.  Neck:     Thyroid: No thyromegaly.     Vascular: No carotid bruit or JVD.     Trachea: No tracheal deviation.  Cardiovascular:     Rate and Rhythm: Normal rate and regular rhythm.     Heart sounds: Normal heart sounds. No murmur heard.  No friction rub. No gallop.   Pulmonary:     Effort: Pulmonary effort is normal. No respiratory distress.     Breath sounds: Normal breath sounds. No wheezing, rhonchi or rales.  Abdominal:     General: Bowel sounds are normal.  Palpations: Abdomen is soft. There is no mass.     Tenderness: There is no abdominal tenderness. There is no guarding or rebound.  Musculoskeletal:        General: No tenderness. Normal range of motion.     Cervical back: Normal range of motion and neck supple.  Lymphadenopathy:     Cervical: No cervical adenopathy.  Skin:    General: Skin is warm.     Capillary Refill: Capillary refill takes less than 2 seconds.     Findings: No rash.  Neurological:     Mental Status: She is alert and oriented to person, place, and time.     Cranial Nerves: No cranial nerve deficit.     Deep Tendon Reflexes: Reflexes normal.  Psychiatric:        Mood and Affect: Mood is not anxious or depressed.     Wt Readings from Last 3 Encounters:  05/12/20 136 lb 6.4 oz (61.9 kg)  12/19/19 137 lb (62.1 kg)  11/28/19 136 lb (61.7 kg)    BP 132/80    Pulse 78    Ht 5\' 2"  (1.575 m)    Wt 136 lb 6.4 oz (61.9 kg)    LMP 02/23/2018    SpO2 97%    BMI 24.95 kg/m   Assessment and Plan:  1. Hyperlipidemia, unspecified hyperlipidemia type Chronic.  Controlled.  Patient is having some metallic taste in her mouth which occurred about the time that she started her statin.  There was a mild to moderate  elevation of her LDL and so we will discontinue her statin and will trial with Dash diet to lower cholesterol and will recheck in a couple months.  2. Essential hypertension Chronic.  Controlled.  Stable.  Patient feels like this is drying her out.  Mucous membranes are relatively moist as well as is noted that the weight has not significantly changed.  However we will discontinue her hydrochlorothiazide.  We will check a renal function panel to evaluate for GFR and electrolytes.  We will recheck blood pressure and cholesterol fasting in 6 to 8 weeks. - Renal Function Panel  3. Taking medication for chronic disease Patient taking medications hydrochlorothiazide for blood pressure and patient is concerned about dehydration and we will check electrolytes and GFR with renal function panel.

## 2020-05-13 LAB — RENAL FUNCTION PANEL
Albumin: 4.8 g/dL (ref 3.8–4.9)
BUN/Creatinine Ratio: 15 (ref 9–23)
BUN: 12 mg/dL (ref 6–24)
CO2: 26 mmol/L (ref 20–29)
Calcium: 9.7 mg/dL (ref 8.7–10.2)
Chloride: 100 mmol/L (ref 96–106)
Creatinine, Ser: 0.8 mg/dL (ref 0.57–1.00)
GFR calc Af Amer: 97 mL/min/{1.73_m2} (ref >59)
GFR calc non Af Amer: 84 mL/min/{1.73_m2} (ref >59)
Glucose: 96 mg/dL (ref 65–99)
Phosphorus: 3.2 mg/dL (ref 3.0–4.3)
Potassium: 4 mmol/L (ref 3.5–5.2)
Sodium: 137 mmol/L (ref 134–144)

## 2020-06-08 DIAGNOSIS — Z8719 Personal history of other diseases of the digestive system: Secondary | ICD-10-CM | POA: Insufficient documentation

## 2020-06-08 DIAGNOSIS — I10 Essential (primary) hypertension: Secondary | ICD-10-CM | POA: Insufficient documentation

## 2020-06-23 ENCOUNTER — Ambulatory Visit: Payer: BC Managed Care – PPO | Admitting: Family Medicine

## 2020-11-08 ENCOUNTER — Telehealth: Payer: Self-pay

## 2020-11-08 NOTE — Telephone Encounter (Signed)
Pt is going to call back and schedule appt for med refill for this month

## 2021-01-05 DIAGNOSIS — Z780 Asymptomatic menopausal state: Secondary | ICD-10-CM | POA: Insufficient documentation

## 2021-04-07 ENCOUNTER — Other Ambulatory Visit: Payer: Self-pay | Admitting: Family Medicine

## 2021-04-07 DIAGNOSIS — Z1231 Encounter for screening mammogram for malignant neoplasm of breast: Secondary | ICD-10-CM

## 2021-04-28 ENCOUNTER — Ambulatory Visit
Admission: RE | Admit: 2021-04-28 | Discharge: 2021-04-28 | Disposition: A | Discharge status: Home / Self Care | Payer: BC Managed Care – PPO | Source: Ambulatory Visit | Attending: Family Medicine | Admitting: Family Medicine

## 2021-04-28 ENCOUNTER — Other Ambulatory Visit: Payer: Self-pay

## 2021-04-28 DIAGNOSIS — Z1231 Encounter for screening mammogram for malignant neoplasm of breast: Secondary | ICD-10-CM | POA: Insufficient documentation

## 2022-01-17 ENCOUNTER — Other Ambulatory Visit: Payer: Self-pay | Admitting: Family Medicine

## 2022-01-17 ENCOUNTER — Ambulatory Visit
Admission: RE | Admit: 2022-01-17 | Discharge: 2022-01-17 | Disposition: A | Discharge status: Home / Self Care | Payer: BC Managed Care – PPO | Attending: Family Medicine | Admitting: Family Medicine

## 2022-01-17 ENCOUNTER — Ambulatory Visit
Admission: RE | Admit: 2022-01-17 | Discharge: 2022-01-17 | Disposition: A | Discharge status: Home / Self Care | Payer: BC Managed Care – PPO | Source: Ambulatory Visit | Attending: Family Medicine | Admitting: Family Medicine

## 2022-01-17 DIAGNOSIS — R52 Pain, unspecified: Secondary | ICD-10-CM

## 2022-01-25 ENCOUNTER — Ambulatory Visit
Admission: EM | Admit: 2022-01-25 | Discharge: 2022-01-25 | Disposition: A | Discharge status: Home / Self Care | Payer: BC Managed Care – PPO | Attending: Emergency Medicine | Admitting: Emergency Medicine

## 2022-01-25 DIAGNOSIS — R030 Elevated blood-pressure reading, without diagnosis of hypertension: Secondary | ICD-10-CM | POA: Diagnosis not present

## 2022-01-25 DIAGNOSIS — R42 Dizziness and giddiness: Secondary | ICD-10-CM

## 2022-01-25 DIAGNOSIS — K589 Irritable bowel syndrome without diarrhea: Secondary | ICD-10-CM | POA: Insufficient documentation

## 2022-01-25 LAB — URINALYSIS, ROUTINE W REFLEX MICROSCOPIC
Bilirubin Urine: NEGATIVE
Glucose, UA: NEGATIVE mg/dL
Hgb urine dipstick: NEGATIVE
Ketones, ur: NEGATIVE mg/dL
Nitrite: NEGATIVE
Protein, ur: NEGATIVE mg/dL
Specific Gravity, Urine: 1.015 (ref 1.005–1.030)
pH: 8.5 — ABNORMAL HIGH (ref 5.0–8.0)

## 2022-01-25 LAB — GLUCOSE, CAPILLARY: Glucose-Capillary: 104 mg/dL — ABNORMAL HIGH (ref 70–99)

## 2022-01-25 LAB — URINALYSIS, MICROSCOPIC (REFLEX)

## 2022-01-25 NOTE — ED Triage Notes (Signed)
Patient is here for "light-headedness, nausea, stomach feels bloated, I feel like by BP is up". No vomiting. No chest pain. No chest pressure. No sob. No recent cold or vial illness.  ?

## 2022-01-25 NOTE — ED Provider Notes (Signed)
HPI ? ?SUBJECTIVE: ? ?Diane Marks is a 56 y.o. female who presents with not "feeling right" starting at 1545 this afternoon.  She reports lightheadedness.  She measured her blood pressure at home and it measured 130s /116.  She states her blood pressure normally averages 118/82.  She reports a bandlike headache earlier today as similar to previous stress headaches, but states that this has resolved.  She reports nausea and feeling bloated.  No vertigo, chest pain, shortness of breath, palpitations, chest pressure or heaviness, vomiting, diaphoresis, visual changes, arm or leg weakness, slurred speech, abdominal pain, seizures, syncope.  She has been in her usual state of health up until today.  She tried taking a pill of L-theanine and lay down without improvement in her symptoms.  Symptoms are worse with eating.  She states that she has been under more stress recently, and has been having issues with constipation over the past 2 weeks.  No melena, hematochezia, urinary complaints.  No recent change in her medications.  She states she had similar symptoms before, which was found to be due to complicated diverticulitis that required IV antibiotics and admission to the hospital.  No history of hypertension.  She has a past medical history of diverticulitis x2 managed medically, hypercholesterolemia prediabetes.  No history of chronic kidney disease, stroke, arrhythmia, atrial fibrillation.  PCP: [facility] ? ? ? ?Past Medical History:  ?Diagnosis Date  ? Diverticulitis   ? Hyperlipidemia   ? Irritable bowel syndrome   ? Vertigo   ? no episodes 12-18 months  ? Wears contact lenses   ? ? ?Past Surgical History:  ?Procedure Laterality Date  ? CESAREAN SECTION    ? COLONOSCOPY WITH PROPOFOL N/A 08/05/2015  ? Procedure: COLONOSCOPY WITH PROPOFOL;  Surgeon: Midge Miniumarren Wohl, MD;  [facility];  Service: Endoscopy;  Laterality: N/A;  ? ? ?Family History  ?Problem Relation Age of Onset  ?  Hyperlipidemia Mother   ? Diabetes Father   ? Hyperlipidemia Father   ? Hypertension Father   ? Pancreatic cancer Father [id number]  ? Heart disease Maternal Grandmother   ? Cancer Paternal Grandmother [id number]  ?     mouth  ? Breast cancer Neg Hx   ? ? ?Social History  ? ?Tobacco Use  ? Smoking status: Former  ? Smokeless tobacco: Never  ? Tobacco comments:  ?  quit 20 yrs ago  ?Vaping Use  ? Vaping Use: Never used  ?Substance Use Topics  ? Alcohol use: Yes  ?  Alcohol/week: 0.0 standard drinks  ?  Comment: rare  ? Drug use: No  ? ? ?No current facility-administered medications for this encounter. ? ?Current Outpatient Medications:  ?  atorvastatin (LIPITOR) 10 MG tablet, Take by mouth., Disp: , Rfl:  ?  L-THEANINE PO, Take by mouth., Disp: , Rfl:  ?  UNABLE TO FIND, Med Name: Co Q 10, Disp: , Rfl:  ?  venlafaxine XR (EFFEXOR-XR) 37.5 MG 24 hr capsule, Take by mouth., Disp: , Rfl:  ?  Cholecalciferol (VITAMIN D-3 PO), Take by mouth., Disp: , Rfl:  ?  Cholecalciferol (VITAMIN D3) 100000 UNIT/GM POWD, Take by mouth., Disp: , Rfl:  ?  guaiFENesin (HERBAL EXPEC PO), Herbal Name: Vitamins C, zinc, magnesium and fish oil., Disp: , Rfl:  ?  hydrochlorothiazide (HYDRODIURIL) 25 MG tablet, Take 1 tablet by mouth daily., Disp: , Rfl:  ?  hydrOXYzine (ATARAX/VISTARIL) 10 MG tablet, Take 10 mg by mouth 3 (three) times daily  as needed., Disp: , Rfl:  ?  Multiple Vitamin (MULTIVITAMIN) tablet, Take 1 tablet by mouth daily., Disp: , Rfl:  ? ?No Known Allergies ? ? ?ROS ? ?As noted in HPI.  ? ?Physical Exam ? ?Pulse 89   Temp 98 ?F (36.7 ?C) (Oral)   Resp 20   Ht 5\' 2"  (1.575 m)   Wt 61.7 kg   LMP 02/23/2018   SpO2 98%   BMI 24.87 kg/m?  ? ?Orthostatic VS for the past 24 hrs: ? BP- Lying Pulse- Lying BP- Standing at 0 minutes Pulse- Standing at 0 minutes  ?01/25/22 1926 (!) 131/93 89 (!) 156/114 91  ? ?  ?Constitutional: Well developed, well nourished, no acute distress ?Eyes:  EOMI, conjunctiva normal bilaterally ?HENT: Normocephalic,  atraumatic,mucus membranes moist.  TMs normal bilaterally.  No temporal artery tenderness. ?Neck: No trapezial tenderness, meningismus. ?Respiratory: Normal inspiratory effort, lungs clear bilaterally, good air movement ?Cardiovascular: Normal rate, regular rhythm, no murmurs rubs or gallop ?GI: nondistended soft, nontender, active bowel sounds, no rebound, guarding.  Negative Murphy. ?Back: No CVAT ?skin: No rash, skin intact ?Musculoskeletal: no deformities. ?Neurologic: Alert & oriented x 3, no focal neuro deficits.  Coordination normal.  GCS 15.  Speech fluent.  Cranial nerves III through XII grossly intact. ?Psychiatric: Speech and behavior appropriate ? ? ?ED Course ? ? ?Medications - No data to display ? ?Orders Placed This Encounter  ?Procedures  ? Glucose, capillary  ?  Standing Status:   Standing  ?  Number of Occurrences:   1  ? Urinalysis, Routine w reflex microscopic Urine, Clean Catch  ?  Standing Status:   Standing  ?  Number of Occurrences:   1  ? Urinalysis, Microscopic (reflex)  ?  Standing Status:   Standing  ?  Number of Occurrences:   1  ? CBG monitoring, ED  ?  Standing Status:   Standing  ?  Number of Occurrences:   1  ? ED EKG  ?  Standing Status:   Standing  ?  Number of Occurrences:   1  ?  Order Specific Question:   Reason for Exam  ?  Answer:   Chest Pain  ? EKG 12-Lead  ?  Standing Status:   Standing  ?  Number of Occurrences:   1  ? ? ?Results for orders placed or performed during the hospital encounter of 01/25/22 (from the past 24 hour(s))  ?Glucose, capillary     Status: Abnormal  ? Collection Time: 01/25/22  7:47 PM  ?Result Value Ref Range  ? Glucose-Capillary 104 (H) 70 - 99 mg/dL  ?Urinalysis, Routine w reflex microscopic Urine, Clean Catch     Status: Abnormal  ? Collection Time: 01/25/22  8:20 PM  ?Result Value Ref Range  ? Color, Urine YELLOW YELLOW  ? APPearance CLEAR CLEAR  ? Specific Gravity, Urine 1.015 1.005 - 1.030  ? pH 8.5 (H) 5.0 - 8.0  ? Glucose, UA NEGATIVE NEGATIVE  mg/dL  ? Hgb urine dipstick NEGATIVE NEGATIVE  ? Bilirubin Urine NEGATIVE NEGATIVE  ? Ketones, ur NEGATIVE NEGATIVE mg/dL  ? Protein, ur NEGATIVE NEGATIVE mg/dL  ? Nitrite NEGATIVE NEGATIVE  ? Leukocytes,Ua TRACE (A) NEGATIVE  ?Urinalysis, Microscopic (reflex)     Status: Abnormal  ? Collection Time: 01/25/22  8:20 PM  ?Result Value Ref Range  ? RBC / HPF 0-5 0 - 5 RBC/hpf  ? WBC, UA 0-5 0 - 5 WBC/hpf  ? Bacteria, UA RARE (A) NONE SEEN  ?  Squamous Epithelial / LPF 0-5 0 - 5  ? ?No results found. ? ?ED Clinical Impression ? ?1. Lightheadedness   ?2. Elevated blood pressure reading   ?  ? ?ED Assessment/Plan ? ?EKG: Normal sinus rhythm, rate 93.  Normal axis, normal intervals.  No hypertrophy.  No ST-T wave changes.  No previous EKG for comparison.  Patient was symptomatic while EKG was obtained. ? ?Patient with lightheadedness and sense of malaise starting this afternoon.  Her blood pressure is elevated, but she is not orthostatic.  Her EKG is reassuring, she does not have any proteinuria.  She has a few bacteria in her urine and trace leukocytes, but she has no urinary complaints.  However, will send off for culture prior to initiating treatment for UTI.  Her symptoms could be caused by an early UTI, early diverticulitis.  She denies vertigo, chest pain, shortness of breath.  She states that she has been under significant amount of stress recently. She has no evidence of a hypertensive emergency at this time.   will ask her to keep a log of her blood pressure, measure it once a day, preferably at the same time every day, follow-up with her primary care provider in 3 to 5 days if it remains consistently elevated above 140/90.  Strict hypertensive emergency ER return precautions given.  ? ?If patient starts to have urinary symptoms, she is to call here and we can call in an antibiotic/Pyridium for her.  Discussed this with patient. ? ?Discussed labs, EKG, MDM, treatment plan, and plan for follow-up with patient.  Discussed sn/sx that should prompt return to the ED. patient agrees with plan.  ? ?No orders of the defined types were placed in this encounter. ? ? ? ? ?*This clinic note was created using Scientist, clinical (histocompatibility and immunogenetics). There

## 2022-01-25 NOTE — Discharge Instructions (Addendum)
Your urinalysis had no protein in it,  there is no evidence of kidney damage.  However you have a few bacteria and trace leukocytes and.  Your symptoms could be caused by a very early UTI, but I am going to send your urine off for culture prior to initiating antibiotic treatment.  ? ? ?Continue your low-salt diet and exercise it is important to keep your blood pressure under good control, as having a elevated blood pressure for prolonged periods of time significantly increases your risk of stroke, heart attacks, kidney damage, eye damage, and other problems.  Please follow-up with your primary care provider in 5 to 7 days if your blood pressure remains consistently above 140/90.  Measure your blood pressure once a day, preferably at the same time every day. Keep a log of this and bring it to your next doctor's appointment.  Bring your blood pressure cuff as well. Return immediately to the ER if you start having chest pain, headache, problems seeing, problems talking, problems walking, if you feel like you're about to pass out, if you do pass out, if you have a seizure, or for any other concerns. ? ?Go to www.goodrx.com  or www.costplusdrugs.com to look up your medications. This will give you a list of where you can find your prescriptions at the most affordable prices. Or ask the pharmacist what the cash price is, or if they have any other discount programs available to help make your medication more affordable. This can be less expensive than what you would pay with insurance.   ?

## 2022-02-16 IMAGING — MG DIGITAL SCREENING BILAT W/ TOMO W/ CAD
8 series · 8 of 24 positions shown · non-contrast
Comparison: Previous exam(s).

CLINICAL DATA: Screening.

EXAM:
DIGITAL SCREENING BILATERAL MAMMOGRAM WITH TOMO AND CAD

[L CC synth-2D]
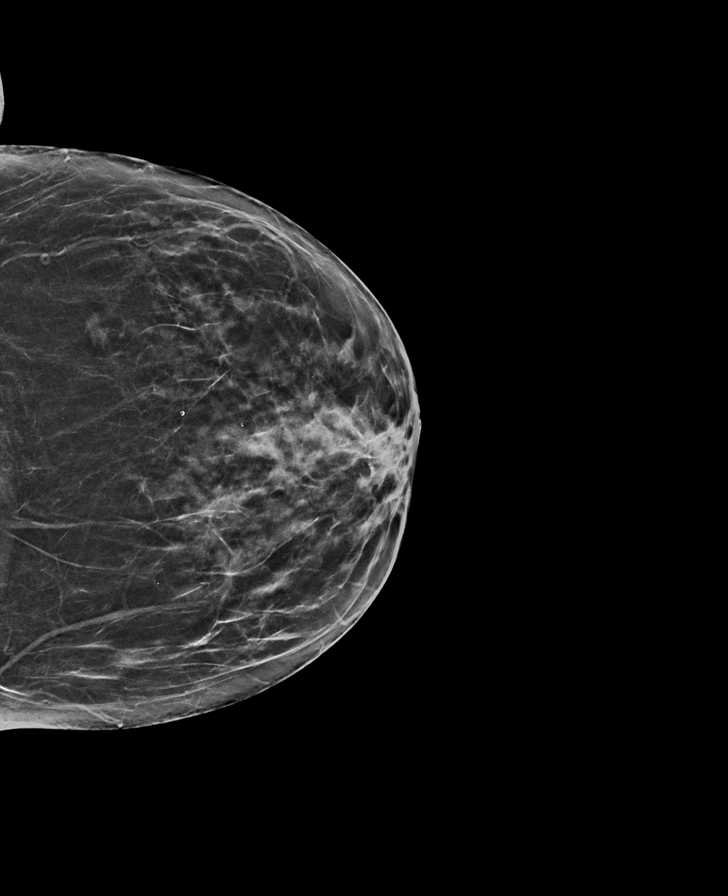

[R MLO synth-2D]
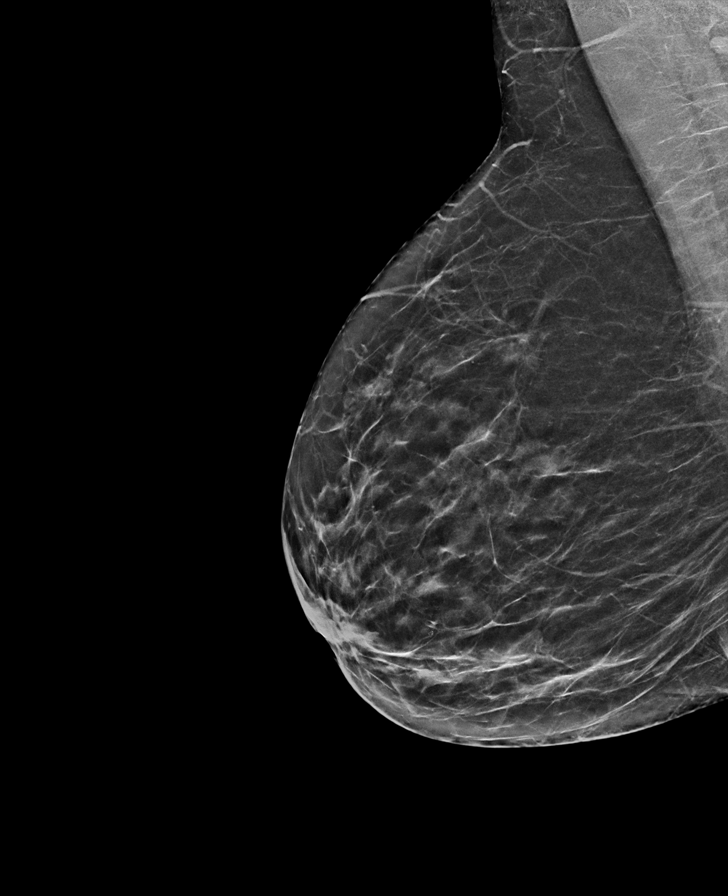

[L MLO synth-2D]
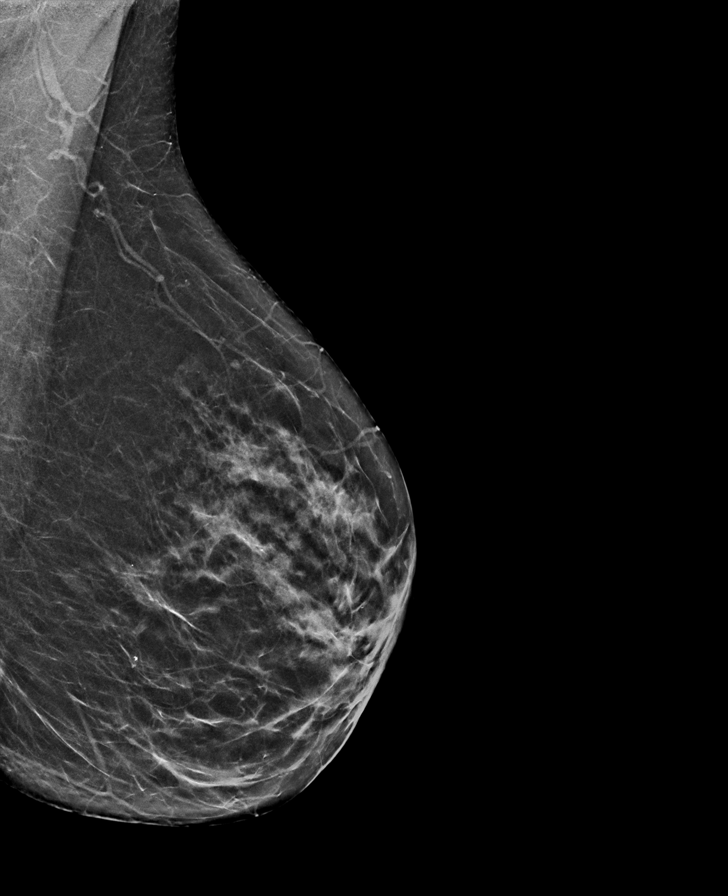

[R CC synth-2D]
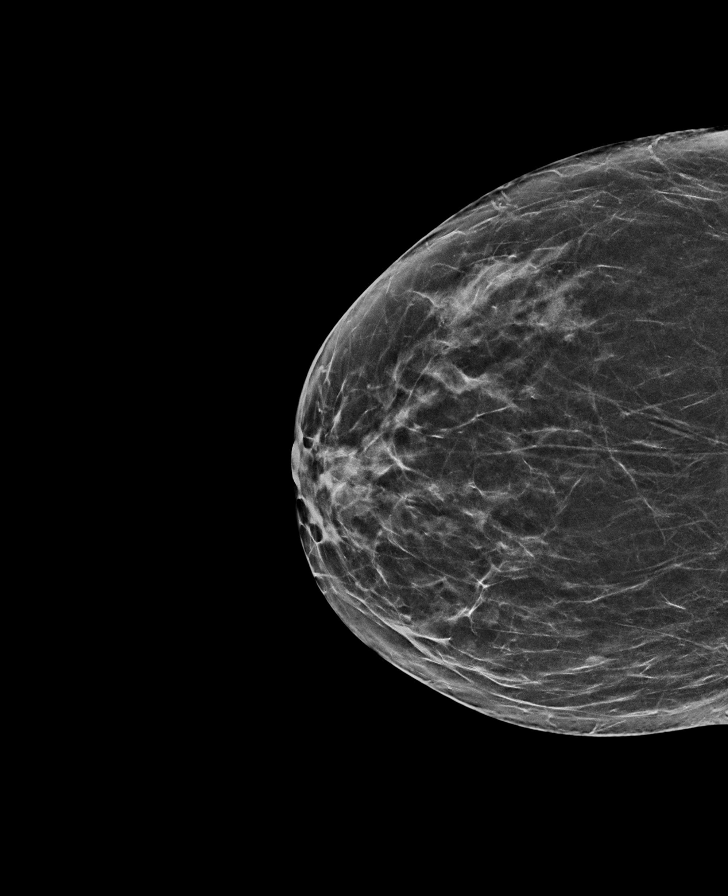

[L CC tomo · tomo slice 29/57.0]
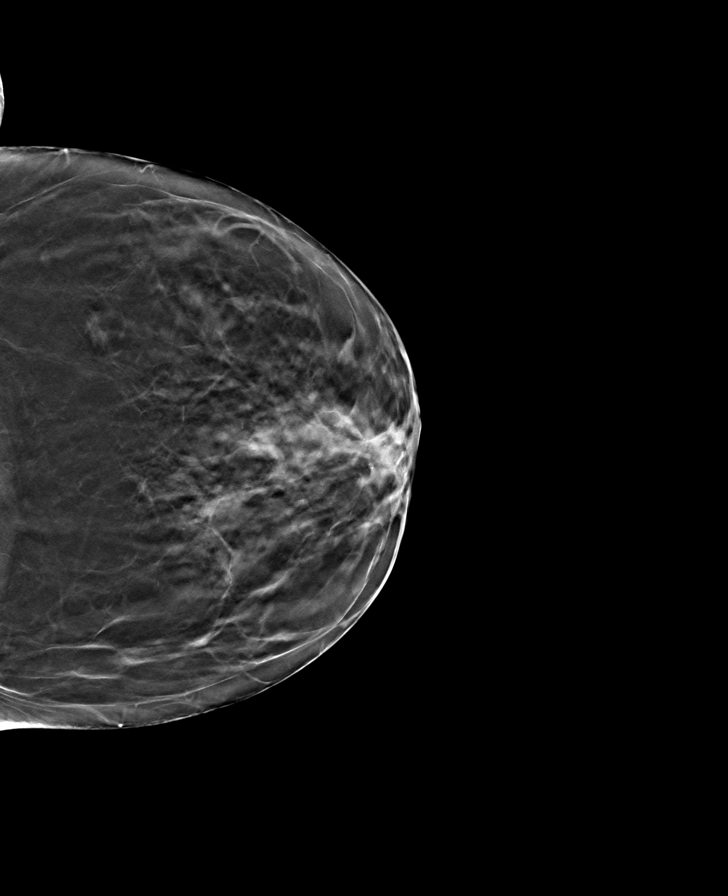

[L MLO tomo · tomo slice 29/57.0]
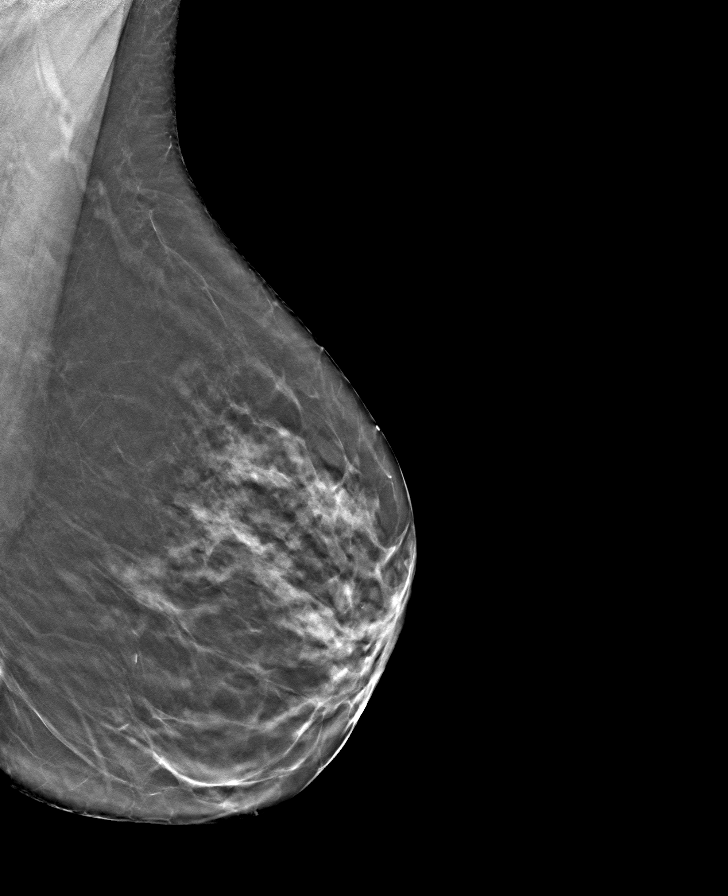

[R CC tomo · tomo slice 31/60.0]
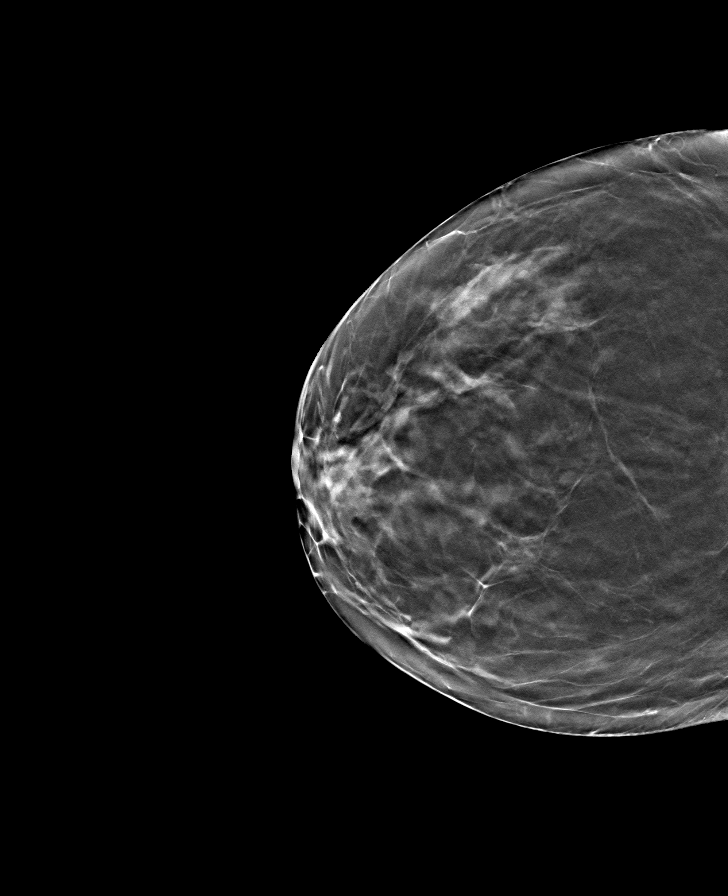

[R MLO tomo · tomo slice 31/62.0]
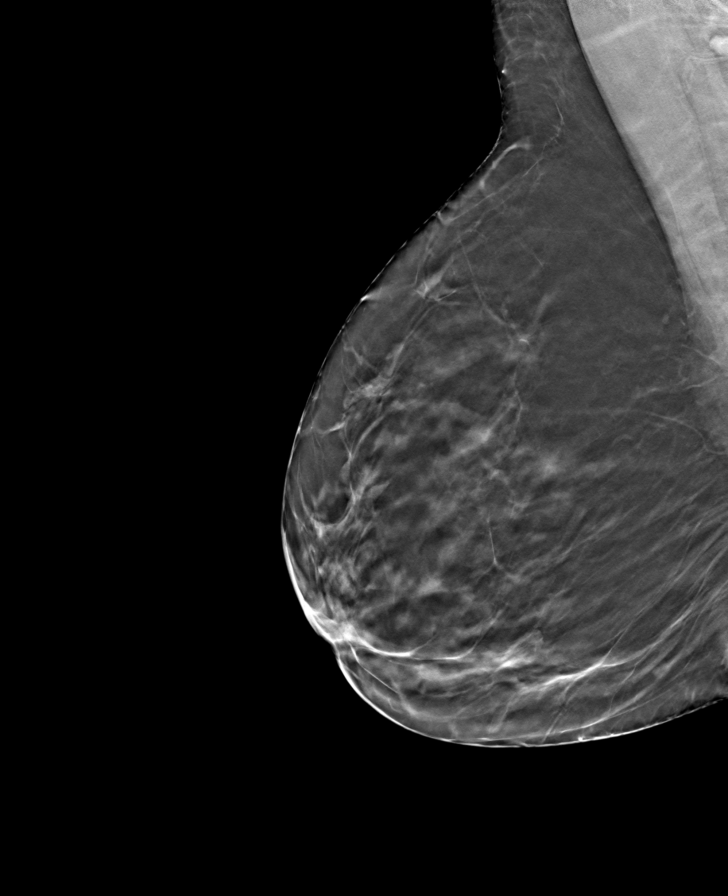

[8 of 24 positions shown; findings below may reference images not displayed]

ACR Breast Density Category b: There are scattered areas of
fibroglandular density.
FINDINGS: There are no findings suspicious for malignancy. Images were
processed with CAD.
IMPRESSION: No mammographic evidence of malignancy. A result letter of this
screening mammogram will be mailed directly to the patient.

RECOMMENDATION:
Screening mammogram in one year. (Code:[id number])

BI-RADS CATEGORY  1: Negative.

## 2022-06-01 ENCOUNTER — Ambulatory Visit
Admission: EM | Admit: 2022-06-01 | Discharge: 2022-06-01 | Disposition: A | Discharge status: Home / Self Care | Payer: BC Managed Care – PPO | Attending: Physician Assistant | Admitting: Physician Assistant

## 2022-06-01 ENCOUNTER — Encounter: Payer: Self-pay | Admitting: Emergency Medicine

## 2022-06-01 ENCOUNTER — Ambulatory Visit (INDEPENDENT_AMBULATORY_CARE_PROVIDER_SITE_OTHER): Payer: BC Managed Care – PPO

## 2022-06-01 ENCOUNTER — Other Ambulatory Visit: Payer: Self-pay

## 2022-06-01 DIAGNOSIS — J209 Acute bronchitis, unspecified: Secondary | ICD-10-CM

## 2022-06-01 DIAGNOSIS — R0602 Shortness of breath: Secondary | ICD-10-CM

## 2022-06-01 DIAGNOSIS — R059 Cough, unspecified: Secondary | ICD-10-CM

## 2022-06-01 DIAGNOSIS — R062 Wheezing: Secondary | ICD-10-CM

## 2022-06-01 DIAGNOSIS — R079 Chest pain, unspecified: Secondary | ICD-10-CM

## 2022-06-01 DIAGNOSIS — R051 Acute cough: Secondary | ICD-10-CM

## 2022-06-01 MED ORDER — HYDROCODONE BIT-HOMATROP MBR 5-1.5 MG/5ML PO SOLN: 5.0000 mL | Freq: Four times a day (QID) | ORAL | 0 refills | Status: AC | PRN

## 2022-06-01 MED ORDER — ALBUTEROL SULFATE (2.5 MG/3ML) 0.083% IN NEBU
2.5000 mg | INHALATION_SOLUTION | Freq: Once | RESPIRATORY_TRACT | Status: AC
  Administered 2022-06-01: 2.5 mg via RESPIRATORY_TRACT

## 2022-06-01 NOTE — Discharge Instructions (Addendum)
-  The x-ray does not show any evidence of pneumonia. - You have bronchitis which is due to a virus 90% of the time and can take a couple of weeks to get better. - Go ahead and continue the azithromycin since you have started it. - Continue the prednisone as this will help to decrease inflammation and open up your lungs. - Continue to use albuterol as needed. - I have sent cough medication to pharmacy. - Follow-up with your primary care provider. - Go to emergency department if you have any increased pain when coughing or breathing, fever or increased breathing difficulty.

## 2022-06-01 NOTE — ED Provider Notes (Signed)
[facility]    CSN: [id number] Arrival date & time: 06/01/22  1744      History   Chief Complaint Chief Complaint  Patient presents with   Cough    HPI Diane Marks is a 56 y.o. female presenting for cough and congestion for 1 week.  Patient reports the cough was dry at first but has become productive over the past day or so.  She also reports wheezing and shortness of breath when she is coughing.  Reports getting a lot of coughing attacks and having shortness of breath at that time.  She also reports pain of her rib cage especially when coughing.  She has not had any fevers.  She was taking Mucinex and OTC cough meds until today. Patient reports that she was seen by her PCP yesterday and received prescriptions for azithromycin, prednisone, and albuterol inhaler.  Reports she tried the albuterol inhaler but says she did not think it helped her breathing.  She has no history of asthma, COPD or other lung disease.  No sick contacts.  No other complaints.  HPI  Past Medical History:  Diagnosis Date   Diverticulitis    Hyperlipidemia    Irritable bowel syndrome    Vertigo    no episodes 12-18 months   Wears contact lenses     Patient Active Problem List   Diagnosis Date Noted   IBS (irritable bowel syndrome) 01/25/2022   Post-menopause 01/05/2021   Benign essential hypertension 06/08/2020   History of diverticulitis 06/08/2020   Postmenopause 11/28/2019   Dizziness 11/28/2019   Constipation 06/12/2019   Diverticulitis 06/12/2019   Gram-negative bacteremia 06/12/2019   Transaminitis 06/12/2019   Perimenopause 10/09/2017   Special screening for malignant neoplasms, colon    Fatigue 03/09/2015   Visual disturbance 03/09/2015   History of tobacco use 03/02/2015   Hyperlipidemia, mixed 03/02/2015   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   Routine general medical examination at a health care facility 03/01/2015   At risk of disease 03/01/2015   Cephalalgia 03/01/2015    Irritable bowel syndrome 03/01/2015    Past Surgical History:  Procedure Laterality Date   CESAREAN SECTION     COLONOSCOPY WITH PROPOFOL N/A 08/05/2015   Procedure: COLONOSCOPY WITH PROPOFOL;  Surgeon: Midge Minium, MD;  Location: [facility];  Service: Endoscopy;  Laterality: N/A;    OB History     Gravida  4   Para  2   Term      Preterm      AB  2   Living  2      SAB      IAB  1   Ectopic  1   Multiple      Live Births               Home Medications    Prior to Admission medications   Medication Sig Start Date End Date Taking? Authorizing Provider  atorvastatin (LIPITOR) 10 MG tablet Take by mouth. 11/14/21  Yes [provider]  Cholecalciferol (VITAMIN D-3 PO) Take by mouth.   Yes [provider]  HYDROcodone bit-homatropine (HYCODAN) 5-1.5 MG/5ML syrup Take 5 mLs by mouth every 6 (six) hours as needed for cough. 06/01/22  Yes Shirlee Latch, PA-C  Cholecalciferol (VITAMIN D3) 100000 UNIT/GM POWD Take by mouth.    [provider]  guaiFENesin (HERBAL EXPEC PO) Herbal Name: Vitamins C, zinc, magnesium and fish oil.    [provider]  hydrochlorothiazide (HYDRODIURIL) 25 MG  tablet Take 1 tablet by mouth daily. 01/05/21   [provider]  hydrOXYzine (ATARAX/VISTARIL) 10 MG tablet Take 10 mg by mouth 3 (three) times daily as needed.    [provider]  L-THEANINE PO Take by mouth.    [provider]  Multiple Vitamin (MULTIVITAMIN) tablet Take 1 tablet by mouth daily.    [provider]  UNABLE TO FIND Med Name: Co Q 10    [provider]  venlafaxine XR (EFFEXOR-XR) 37.5 MG 24 hr capsule Take by mouth. 01/10/21   [provider]    Family History Family History  Problem Relation Age of Onset   Hyperlipidemia Mother    Diabetes Father    Hyperlipidemia Father    Hypertension Father    Pancreatic cancer Father 57   Heart disease Maternal Grandmother     Cancer Paternal Grandmother 43       mouth   Breast cancer Neg Hx     Social History Social History   Tobacco Use   Smoking status: Former   Smokeless tobacco: Never   Tobacco comments:    quit 20 yrs ago  Vaping Use   Vaping Use: Never used  Substance Use Topics   Alcohol use: Yes    Alcohol/week: 0.0 standard drinks of alcohol    Comment: rare   Drug use: No     Allergies   Patient has no known allergies.   Review of Systems Review of Systems  Constitutional:  Negative for chills, diaphoresis, fatigue and fever.  HENT:  Positive for congestion. Negative for ear pain, rhinorrhea, sinus pressure, sinus pain and sore throat.   Respiratory:  Positive for cough, shortness of breath and wheezing.   Gastrointestinal:  Negative for abdominal pain, nausea and vomiting.  Musculoskeletal:  Negative for arthralgias and myalgias.  Skin:  Negative for rash.  Neurological:  Negative for weakness and headaches.  Hematological:  Negative for adenopathy.     Physical Exam Triage Vital Signs ED Triage Vitals  Enc Vitals Group     BP 06/01/22 1848 (!) 145/91     Pulse Rate 06/01/22 1848 84     Resp 06/01/22 1848 16     Temp 06/01/22 1848 98.2 F (36.8 C)     Temp Source 06/01/22 1848 Oral     SpO2 06/01/22 1848 98 %     Weight 06/01/22 1846 136 lb 0.4 oz (61.7 kg)     Height 06/01/22 1846 5\' 2"  (1.575 m)     Head Circumference --      Peak Flow --      Pain Score 06/01/22 1845 2     Pain Loc --      Pain Edu? --      Excl. in GC? --    No data found.  Updated Vital Signs BP (!) 145/91 (BP Location: Right Arm)   Pulse 84   Temp 98.2 F (36.8 C) (Oral)   Resp 16   Ht 5\' 2"  (1.575 m)   Wt 136 lb 0.4 oz (61.7 kg)   LMP 02/23/2018   SpO2 98%   BMI 24.88 kg/m      Physical Exam Vitals and nursing note reviewed.  Constitutional:      General: She is not in acute distress.    Appearance: Normal appearance. She is not ill-appearing or toxic-appearing.  HENT:      Head: Normocephalic and atraumatic.     Nose: Nose normal.     Mouth/Throat:  Mouth: Mucous membranes are moist.     Pharynx: Oropharynx is clear.  Eyes:     General: No scleral icterus.       Right eye: No discharge.        Left eye: No discharge.     Conjunctiva/sclera: Conjunctivae normal.  Cardiovascular:     Rate and Rhythm: Normal rate and regular rhythm.     Heart sounds: Normal heart sounds.  Pulmonary:     Effort: Pulmonary effort is normal. No respiratory distress.     Breath sounds: Wheezing and rhonchi present.     Comments: Diffuse wheezing and rhonchi throughout all lung fields.  Patient has difficulty taking deep breath as this causes her to cough. Musculoskeletal:     Cervical back: Neck supple.  Skin:    General: Skin is dry.  Neurological:     General: No focal deficit present.     Mental Status: She is alert. Mental status is at baseline.     Motor: No weakness.     Gait: Gait normal.  Psychiatric:        Mood and Affect: Mood normal.        Behavior: Behavior normal.        Thought Content: Thought content normal.      UC Treatments / Results  Labs (all labs ordered are listed, but only abnormal results are displayed) Labs Reviewed - No data to display  EKG   Radiology DG Chest 2 View  Result Date: 06/01/2022 CLINICAL DATA:  Cough, wheezing EXAM: CHEST - 2 VIEW COMPARISON:  None Available. FINDINGS: 5 mm calcified granuloma in the right lower lung, benign. Left lung is clear. No pleural effusion or pneumothorax. Heart is normal in size. Visualized osseous structures are within normal limits. IMPRESSION: Normal chest radiographs. Electronically Signed   By: Charline Bills M.D.   On: 06/01/2022 19:08    Procedures Procedures (including critical care time)  Medications Ordered in UC Medications  albuterol (PROVENTIL) (2.5 MG/3ML) 0.083% nebulizer solution 2.5 mg (2.5 mg Nebulization Given 06/01/22 1914)    Initial Impression / Assessment and  Plan / UC Course  I have reviewed the triage vital signs and the nursing notes.  Pertinent labs & imaging results that were available during my care of the patient were reviewed by me and considered in my medical decision making (see chart for details).   56 year old female presenting for 1 week history of cough and congestion.  New onset productive cough, wheezing and shortness of breath.  She saw her PCP yesterday for the symptoms and was prescribed azithromycin and prednisone.  Reported shortness of breath today and was prescribed albuterol inhaler which she tried and says did not seem to help.  I did discuss with her the appropriate way to use the inhaler and it sounds like she knows how to use it.  Vitals are stable.  She is overall well-appearing.  On exam she has diffuse wheezing and rhonchi throughout all lung fields but no respiratory distress.  Chest x-ray obtained today and reviewed by me.  No acute abnormality.  Discussed result with patient.  Patient given albuterol nebulized treatment in clinic.  She says she thinks it is helpful.  Advised patient that her symptoms are consistent with bronchitis which is most likely viral especially given that there is no evidence of consolidation on the x-ray.  Advised to go and continue the medications provided by her primary care provider.  We will also send prescription cough medication  for her.  Reviewed controlled substance database and find patient to be low risk for abuse.  ED if any acute worsening of symptoms especially if increased shortness of breath or worsening chest pain or pleuritic pain.   Final Clinical Impressions(s) / UC Diagnoses   Final diagnoses:  Acute bronchitis, unspecified organism  Acute cough  Shortness of breath  Chest pain, unspecified type     Discharge Instructions      -The x-ray does not show any evidence of pneumonia. - You have bronchitis which is due to a virus 90% of the time and can take a couple of  weeks to get better. - Go ahead and continue the azithromycin since you have started it. - Continue the prednisone as this will help to decrease inflammation and open up your lungs. - Continue to use albuterol as needed. - I have sent cough medication to pharmacy. - Follow-up with your primary care provider. - Go to emergency department if you have any increased pain when coughing or breathing, fever or increased breathing difficulty.     ED Prescriptions     Medication Sig Dispense Auth. Provider   HYDROcodone bit-homatropine (HYCODAN) 5-1.5 MG/5ML syrup Take 5 mLs by mouth every 6 (six) hours as needed for cough. 120 mL Shirlee Latch, PA-C      I have reviewed the PDMP during this encounter.   Shirlee Latch, PA-C 06/01/22 1935

## 2022-06-01 NOTE — ED Triage Notes (Signed)
Pt c/o cough, wheezing. She states she was seen yesterday at her PCP and was given prednisone, zpak and inhaler. She states the inhaler is not working. She states she has pain under her breast when she coughs.

## 2023-03-09 ENCOUNTER — Telehealth: Payer: Self-pay

## 2023-03-09 NOTE — Transitions of Care (Post Inpatient/ED Visit) (Signed)
03/09/2023  Name: CAMBRA ADWELL MRN: [id number] DOB: 1966/08/16  Today's TOC FU Call Status: Today's TOC FU Call Status:: Successful TOC FU Call Competed TOC FU Call Complete Date: 03/09/23  Transition Care Management Follow-up Telephone Call Date of Discharge: 03/08/23 Discharge Facility: Other ([facility]) Name of Other ([facility]) Discharge Facility: [facility] Type of Discharge: Emergency Department Reason for ED Visit: Other: (abd. pain and hematuria) How have you been since you were released from the hospital?: Same Any questions or concerns?: No  Items Reviewed: Did you receive and understand the discharge instructions provided?: Yes Medications obtained,verified, and reconciled?: Yes (Medications Reviewed) Any new allergies since your discharge?: No Dietary orders reviewed?: NA Do you have support at home?: Yes People in Home: spouse Name of Support/Comfort Primary Source: Billy  Medications Reviewed Today: Medications Reviewed Today     Reviewed by Everitt Amber (Medical Assistant) on 03/09/23 at 1132  Med List Status: <None>   Medication Order Taking? Sig Documenting Provider Last Dose Status Informant  atorvastatin (LIPITOR) 10 MG tablet [id number] Yes Take by mouth. [provider] Taking Active   Cholecalciferol (VITAMIN D-3 PO) [id number] Yes Take by mouth. [provider] Taking Active   Cholecalciferol (VITAMIN D3) 100000 UNIT/GM POWD [id number] Yes Take by mouth. [provider] Taking Active   guaiFENesin (HERBAL EXPEC PO) [id number] No Herbal Name: Vitamins C, zinc, magnesium and fish oil.  Patient not taking: Reported on 03/09/2023   [provider] Not Taking Active   hydrochlorothiazide (HYDRODIURIL) 25 MG tablet [id number] Yes Take 1 tablet by mouth daily. [provider] Taking Active   HYDROcodone bit-homatropine (HYCODAN) 5-1.5 MG/5ML syrup [id number] No Take 5 mLs by mouth every 6 (six) hours as  needed for cough.  Patient not taking: Reported on 03/09/2023   Gareth Morgan Not Taking Active   hydrOXYzine (ATARAX/VISTARIL) 10 MG tablet [id number] Yes Take 10 mg by mouth 3 (three) times daily as needed. [provider] Taking Active   ketorolac (TORADOL) 10 MG tablet [id number] Yes Take by mouth. [provider] Taking Active   L-THEANINE PO [id number] Yes Take by mouth. [provider] Taking Active   Multiple Vitamin (MULTIVITAMIN) tablet [id number] Yes Take 1 tablet by mouth daily. [provider] Taking Active   ondansetron (ZOFRAN-ODT) 4 MG disintegrating tablet [id number] Yes Dissolve 1 tablet (4 mg total) in the mouth every eight (8) hours as needed for nausea for up to 7 days. [provider] Taking Active   tamsulosin (FLOMAX) 0.4 MG CAPS capsule [id number] Yes Take by mouth. [provider] Taking Active   UNABLE TO FIND [id number] Yes Med Name: Co Q 10 [provider] Taking Active   venlafaxine XR (EFFEXOR-XR) 37.5 MG 24 hr capsule [id number] Yes Take by mouth. [provider] Taking Active             Home Care and Equipment/Supplies: Were Home Health Services Ordered?: NA Any new equipment or medical supplies ordered?: No  Functional Questionnaire: Do you need assistance with bathing/showering or dressing?: No Do you need assistance with meal preparation?: No Do you need assistance with eating?: No Do you have difficulty maintaining continence: No Do you need assistance with getting out of bed/getting out of a chair/moving?: No Do you have difficulty managing or taking your medications?: No  Follow up appointments reviewed: PCP Follow-up appointment confirmed?: Yes Date of PCP follow-up appointment?:  (no longer under our care- Ethelene Browns) Follow-up Provider: Ethelene Browns Specialist  Hospital Follow-up appointment confirmed?: NA Do you need transportation to your follow-up appointment?:  No Do you understand care options if your condition(s) worsen?: Yes-patient verbalized understanding  pt is no longer seeing this provider- switched to Ethelene Browns NP  SIGNATURE Arthur Holms
